# Patient Record
Sex: Female | Born: 2000 | Race: Asian
Health system: Southern US, Academic
[De-identification: ages and names within clinical notes are randomized; demographics above are authoritative.]

## PROBLEM LIST (undated history)

## (undated) ENCOUNTER — Telehealth

## (undated) ENCOUNTER — Encounter

## (undated) ENCOUNTER — Inpatient Hospital Stay: Payer: Medicaid Other

## (undated) DIAGNOSIS — J45909 Unspecified asthma, uncomplicated: Secondary | ICD-10-CM

## (undated) DIAGNOSIS — D649 Anemia, unspecified: Secondary | ICD-10-CM

## (undated) DIAGNOSIS — D499 Neoplasm of unspecified behavior of unspecified site: Secondary | ICD-10-CM

## (undated) DIAGNOSIS — I10 Essential (primary) hypertension: Secondary | ICD-10-CM

## (undated) HISTORY — PX: CRANIOTOMY: SHX93

## (undated) HISTORY — DX: Essential (primary) hypertension: I10

## (undated) HISTORY — PX: OTHER SURGICAL HISTORY: SHX169

## (undated) HISTORY — PX: ADENOIDECTOMY: SUR15

## (undated) HISTORY — PX: TONSILLECTOMY: SUR1361

---
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
## 2020-03-07 ENCOUNTER — Ambulatory Visit
Admit: 2020-03-07 | Discharge: 2020-03-08 | Payer: Medicaid Other | Attending: Obstetrics & Gynecology | Primary: Family Medicine

## 2020-03-07 DIAGNOSIS — F909 Attention-deficit hyperactivity disorder, unspecified type: Secondary | ICD-10-CM

## 2020-03-07 DIAGNOSIS — L659 Nonscarring hair loss, unspecified: Secondary | ICD-10-CM

## 2020-03-07 DIAGNOSIS — Z3490 Encounter for supervision of normal pregnancy, unspecified, unspecified trimester: Principal | ICD-10-CM

## 2020-03-07 DIAGNOSIS — N809 Endometriosis, unspecified: Secondary | ICD-10-CM

## 2020-03-07 DIAGNOSIS — 493 Asthma: Principal | ICD-9-CM

## 2020-03-07 DIAGNOSIS — D649 Anemia, unspecified: Secondary | ICD-10-CM

## 2020-03-07 DIAGNOSIS — Z9109 Other allergy status, other than to drugs and biological substances: Secondary | ICD-10-CM

## 2020-03-07 DIAGNOSIS — M899 Disorder of bone, unspecified: Secondary | ICD-10-CM

## 2020-03-30 ENCOUNTER — Ambulatory Visit: Admit: 2020-03-30 | Discharge: 2020-03-31 | Payer: Medicaid Other | Attending: Family | Primary: Family Medicine

## 2020-03-30 DIAGNOSIS — Z3A15 15 weeks gestation of pregnancy: Secondary | ICD-10-CM

## 2020-03-30 DIAGNOSIS — D649 Anemia, unspecified: Secondary | ICD-10-CM

## 2020-03-30 DIAGNOSIS — F419 Anxiety disorder, unspecified: Secondary | ICD-10-CM

## 2020-03-30 DIAGNOSIS — 493 Asthma: Principal | ICD-9-CM

## 2020-03-30 DIAGNOSIS — M899 Disorder of bone, unspecified: Secondary | ICD-10-CM

## 2020-03-30 DIAGNOSIS — Z9109 Other allergy status, other than to drugs and biological substances: Secondary | ICD-10-CM

## 2020-03-30 DIAGNOSIS — O99012 Anemia complicating pregnancy, second trimester: Secondary | ICD-10-CM

## 2020-03-30 DIAGNOSIS — R7989 Other specified abnormal findings of blood chemistry: Secondary | ICD-10-CM

## 2020-03-30 DIAGNOSIS — Z8742 Personal history of other diseases of the female genital tract: Secondary | ICD-10-CM

## 2020-03-30 DIAGNOSIS — O0992 Supervision of high risk pregnancy, unspecified, second trimester: Principal | ICD-10-CM

## 2020-03-30 DIAGNOSIS — Z9889 Other specified postprocedural states: Secondary | ICD-10-CM

## 2020-03-30 DIAGNOSIS — F32A Anxiety and depression: Secondary | ICD-10-CM

## 2020-03-30 DIAGNOSIS — Z8279 Family history of other congenital malformations, deformations and chromosomal abnormalities: Secondary | ICD-10-CM

## 2020-03-30 DIAGNOSIS — O99212 Obesity complicating pregnancy, second trimester: Secondary | ICD-10-CM

## 2020-03-30 DIAGNOSIS — N809 Endometriosis, unspecified: Secondary | ICD-10-CM

## 2020-03-30 DIAGNOSIS — F909 Attention-deficit hyperactivity disorder, unspecified type: Secondary | ICD-10-CM

## 2020-03-30 DIAGNOSIS — L659 Nonscarring hair loss, unspecified: Secondary | ICD-10-CM

## 2020-03-30 DIAGNOSIS — Z8619 Personal history of other infectious and parasitic diseases: Secondary | ICD-10-CM

## 2020-03-30 MED ORDER — PROMETHAZINE HCL 25 MG PO TABS
ORAL
Start: 2020-03-30 — End: ?

## 2020-03-30 MED ORDER — ACETIC ACID 3% SOLN JX
Freq: Once | Status: CN
Start: 2020-03-30 — End: ?

## 2020-03-30 MED ORDER — FERRIC SUBSULFATE 259 MG/GM EX SOLN
TOPICAL | Status: CN | PRN
Start: 2020-03-30 — End: ?

## 2020-03-30 MED ORDER — ASPIRIN 81 MG PO CHEW
81 mg | Freq: Every day | ORAL | 1 refills | Status: CP
Start: 2020-03-30 — End: ?

## 2020-03-30 MED ORDER — PRENATAL 1 PO
ORAL_TABLET | ORAL
Start: 2020-03-30 — End: ?

## 2020-04-05 DIAGNOSIS — D75839 Thrombocytosis: Secondary | ICD-10-CM

## 2020-04-05 DIAGNOSIS — O99012 Anemia complicating pregnancy, second trimester: Secondary | ICD-10-CM

## 2020-04-05 DIAGNOSIS — O0992 Supervision of high risk pregnancy, unspecified, second trimester: Principal | ICD-10-CM

## 2020-04-26 ENCOUNTER — Ambulatory Visit
Admit: 2020-04-26 | Discharge: 2020-04-27 | Payer: Medicaid Other | Attending: Hematology & Oncology | Primary: Family Medicine

## 2020-04-26 DIAGNOSIS — 493 Asthma: Principal | ICD-9-CM

## 2020-04-26 DIAGNOSIS — N809 Endometriosis, unspecified: Secondary | ICD-10-CM

## 2020-04-26 DIAGNOSIS — O99012 Anemia complicating pregnancy, second trimester: Secondary | ICD-10-CM

## 2020-04-26 DIAGNOSIS — L659 Nonscarring hair loss, unspecified: Secondary | ICD-10-CM

## 2020-04-26 DIAGNOSIS — D649 Anemia, unspecified: Secondary | ICD-10-CM

## 2020-04-26 DIAGNOSIS — F909 Attention-deficit hyperactivity disorder, unspecified type: Secondary | ICD-10-CM

## 2020-04-26 DIAGNOSIS — O0992 Supervision of high risk pregnancy, unspecified, second trimester: Principal | ICD-10-CM

## 2020-04-26 DIAGNOSIS — D75839 Thrombocytosis: Secondary | ICD-10-CM

## 2020-04-26 DIAGNOSIS — Z9109 Other allergy status, other than to drugs and biological substances: Secondary | ICD-10-CM

## 2020-04-26 DIAGNOSIS — M899 Disorder of bone, unspecified: Secondary | ICD-10-CM

## 2020-04-26 MED ORDER — HEMAX 150-1 MG PO TABS
1 mg | ORAL_TABLET | Freq: Every day | ORAL | 6 refills | Status: CP
Start: 2020-04-26 — End: ?

## 2020-04-27 ENCOUNTER — Encounter: Attending: Medical Oncology | Primary: Family Medicine

## 2020-05-04 ENCOUNTER — Ambulatory Visit: Admit: 2020-05-04 | Discharge: 2020-05-05 | Payer: Medicaid Other | Primary: Family Medicine

## 2020-05-04 ENCOUNTER — Ambulatory Visit: Admit: 2020-05-04 | Discharge: 2020-05-05 | Payer: Medicaid Other | Attending: Family | Primary: Family Medicine

## 2020-05-04 ENCOUNTER — Ambulatory Visit
Admit: 2020-05-04 | Discharge: 2020-05-05 | Payer: Medicaid Other | Attending: Maternal & Fetal Medicine | Primary: Family Medicine

## 2020-05-04 ENCOUNTER — Inpatient Hospital Stay: Admit: 2020-05-04 | Discharge: 2020-05-05 | Payer: Medicaid Other | Primary: Family Medicine

## 2020-05-04 DIAGNOSIS — D75839 Thrombocytosis: Secondary | ICD-10-CM

## 2020-05-04 DIAGNOSIS — O99212 Obesity complicating pregnancy, second trimester: Secondary | ICD-10-CM

## 2020-05-04 DIAGNOSIS — Z362 Encounter for other antenatal screening follow-up: Principal | ICD-10-CM

## 2020-05-04 DIAGNOSIS — Z8279 Family history of other congenital malformations, deformations and chromosomal abnormalities: Secondary | ICD-10-CM

## 2020-05-04 DIAGNOSIS — O0992 Supervision of high risk pregnancy, unspecified, second trimester: Principal | ICD-10-CM

## 2020-05-04 DIAGNOSIS — Z3A20 20 weeks gestation of pregnancy: Secondary | ICD-10-CM

## 2020-05-04 DIAGNOSIS — F419 Anxiety disorder, unspecified: Secondary | ICD-10-CM

## 2020-05-04 DIAGNOSIS — Z9889 Other specified postprocedural states: Secondary | ICD-10-CM

## 2020-05-04 DIAGNOSIS — R7989 Other specified abnormal findings of blood chemistry: Secondary | ICD-10-CM

## 2020-05-04 DIAGNOSIS — Z8742 Personal history of other diseases of the female genital tract: Secondary | ICD-10-CM

## 2020-05-04 DIAGNOSIS — O99012 Anemia complicating pregnancy, second trimester: Secondary | ICD-10-CM

## 2020-05-04 DIAGNOSIS — F32A Anxiety and depression: Secondary | ICD-10-CM

## 2020-05-04 MED ORDER — ONDANSETRON 4 MG PO TBDP
4 mg | Freq: Once | ORAL | 0 refills | Status: CP
Start: 2020-05-04 — End: ?

## 2020-05-04 MED ORDER — FERROUS SULFATE ER PO
ORAL
Start: 2020-05-04 — End: ?

## 2020-05-30 ENCOUNTER — Inpatient Hospital Stay: Admit: 2020-05-30 | Discharge: 2020-05-30 | Payer: Medicaid Other

## 2020-06-07 ENCOUNTER — Encounter: Primary: Family Medicine

## 2020-06-07 ENCOUNTER — Inpatient Hospital Stay: Admit: 2020-06-07 | Discharge: 2020-06-07

## 2020-06-07 DIAGNOSIS — F32A Depression: Secondary | ICD-10-CM

## 2020-06-07 DIAGNOSIS — F4310 Post-traumatic stress disorder, unspecified: Secondary | ICD-10-CM

## 2020-06-07 DIAGNOSIS — N939 Abnormal uterine and vaginal bleeding, unspecified: Secondary | ICD-10-CM

## 2020-06-07 DIAGNOSIS — Z9109 Other allergy status, other than to drugs and biological substances: Secondary | ICD-10-CM

## 2020-06-07 DIAGNOSIS — F329 Major depressive disorder, single episode, unspecified: Secondary | ICD-10-CM

## 2020-06-07 DIAGNOSIS — R102 Pelvic and perineal pain: Secondary | ICD-10-CM

## 2020-06-07 DIAGNOSIS — J45909 Unspecified asthma, uncomplicated: Secondary | ICD-10-CM

## 2020-06-07 DIAGNOSIS — F909 Attention-deficit hyperactivity disorder, unspecified type: Secondary | ICD-10-CM

## 2020-06-07 DIAGNOSIS — F319 Bipolar disorder, unspecified: Secondary | ICD-10-CM

## 2020-06-07 DIAGNOSIS — M419 Scoliosis, unspecified: Secondary | ICD-10-CM

## 2020-06-07 DIAGNOSIS — Z8619 Personal history of other infectious and parasitic diseases: Secondary | ICD-10-CM

## 2020-06-07 DIAGNOSIS — A64 Unspecified sexually transmitted disease: Secondary | ICD-10-CM

## 2020-06-07 DIAGNOSIS — G4700 Insomnia, unspecified: Secondary | ICD-10-CM

## 2020-06-07 DIAGNOSIS — A6000 Herpesviral infection of urogenital system, unspecified: Secondary | ICD-10-CM

## 2020-06-07 DIAGNOSIS — Z202 Contact with and (suspected) exposure to infections with a predominantly sexual mode of transmission: Principal | ICD-10-CM

## 2020-06-07 MED ORDER — CEFTRIAXONE SODIUM 1 G IJ SOLR
500 mg | Freq: Once | INTRAMUSCULAR | Status: CP
Start: 2020-06-07 — End: ?

## 2020-06-07 MED ORDER — DOXYCYCLINE HYCLATE 100 MG PO TABS
100 mg | Freq: Two times a day (BID) | ORAL | 0 refills | Status: CP
Start: 2020-06-07 — End: ?

## 2020-06-07 MED ORDER — DOXYCYCLINE HYCLATE 100 MG PO CAPS
100 mg | Freq: Once | ORAL | Status: CP
Start: 2020-06-07 — End: ?

## 2020-06-07 MED ORDER — ONDANSETRON 4 MG PO TBDP
4 mg | Freq: Once | ORAL | Status: CP
Start: 2020-06-07 — End: ?

## 2020-06-07 MED ORDER — CEPHALEXIN 500 MG PO CAPS
500 mg | Freq: Two times a day (BID) | ORAL | 0 refills | 7.00000 days | Status: CP
Start: 2020-06-07 — End: ?

## 2020-06-08 ENCOUNTER — Ambulatory Visit: Payer: Medicaid Other | Primary: Family Medicine

## 2020-06-08 ENCOUNTER — Encounter: Attending: Family | Primary: Family Medicine

## 2020-06-09 MED ORDER — METRONIDAZOLE 500 MG PO TABS
2000 mg | Freq: Once | ORAL | 0 refills | Status: CP
Start: 2020-06-09 — End: ?

## 2020-06-09 MED ORDER — METRONIDAZOLE 500 MG PO TABS
1000 mg | Freq: Once | ORAL | 0 refills | Status: CP
Start: 2020-06-09 — End: 2020-06-09

## 2020-06-14 ENCOUNTER — Inpatient Hospital Stay: Attending: Hematology & Oncology | Primary: Family Medicine

## 2020-06-16 ENCOUNTER — Inpatient Hospital Stay: Admit: 2020-06-16 | Discharge: 2020-06-17 | Payer: Medicaid Other | Primary: Family Medicine

## 2020-06-16 DIAGNOSIS — O99012 Anemia complicating pregnancy, second trimester: Secondary | ICD-10-CM

## 2020-06-16 DIAGNOSIS — O0992 Supervision of high risk pregnancy, unspecified, second trimester: Secondary | ICD-10-CM

## 2020-06-16 DIAGNOSIS — Z362 Encounter for other antenatal screening follow-up: Principal | ICD-10-CM

## 2020-06-16 DIAGNOSIS — D75839 Thrombocytosis: Secondary | ICD-10-CM

## 2020-07-06 ENCOUNTER — Ambulatory Visit: Admit: 2020-07-06 | Discharge: 2020-07-07 | Payer: Medicaid Other | Attending: Family | Primary: Family Medicine

## 2020-07-06 DIAGNOSIS — Z9889 Other specified postprocedural states: Secondary | ICD-10-CM

## 2020-07-06 DIAGNOSIS — Z3A29 29 weeks gestation of pregnancy: Secondary | ICD-10-CM

## 2020-07-06 DIAGNOSIS — O0993 Supervision of high risk pregnancy, unspecified, third trimester: Principal | ICD-10-CM

## 2020-07-06 DIAGNOSIS — L659 Nonscarring hair loss, unspecified: Secondary | ICD-10-CM

## 2020-07-06 DIAGNOSIS — O2600 Excessive weight gain in pregnancy, unspecified trimester: Secondary | ICD-10-CM

## 2020-07-06 DIAGNOSIS — N809 Endometriosis, unspecified: Secondary | ICD-10-CM

## 2020-07-06 DIAGNOSIS — D75839 Thrombocytosis: Secondary | ICD-10-CM

## 2020-07-06 DIAGNOSIS — O99212 Obesity complicating pregnancy, second trimester: Secondary | ICD-10-CM

## 2020-07-06 DIAGNOSIS — Z9109 Other allergy status, other than to drugs and biological substances: Secondary | ICD-10-CM

## 2020-07-06 DIAGNOSIS — O99012 Anemia complicating pregnancy, second trimester: Secondary | ICD-10-CM

## 2020-07-06 DIAGNOSIS — F32A Anxiety and depression: Secondary | ICD-10-CM

## 2020-07-06 DIAGNOSIS — R8271 Bacteriuria: Secondary | ICD-10-CM

## 2020-07-06 DIAGNOSIS — M899 Disorder of bone, unspecified: Secondary | ICD-10-CM

## 2020-07-06 DIAGNOSIS — D649 Anemia, unspecified: Secondary | ICD-10-CM

## 2020-07-06 DIAGNOSIS — Z8742 Personal history of other diseases of the female genital tract: Secondary | ICD-10-CM

## 2020-07-06 DIAGNOSIS — Z8279 Family history of other congenital malformations, deformations and chromosomal abnormalities: Secondary | ICD-10-CM

## 2020-07-06 DIAGNOSIS — F419 Anxiety disorder, unspecified: Secondary | ICD-10-CM

## 2020-07-06 DIAGNOSIS — 493 Asthma: Principal | ICD-9-CM

## 2020-07-06 DIAGNOSIS — F909 Attention-deficit hyperactivity disorder, unspecified type: Secondary | ICD-10-CM

## 2020-07-06 MED ORDER — FERROUS SULFATE 325 (65 FE) MG PO TABS
325 mg | Freq: Every day | ORAL | 1 refills | Status: CP
Start: 2020-07-06 — End: ?

## 2020-07-06 MED ORDER — TETANUS-DIPHTH-ACELL PERTUSSIS 5-2-15.5 LF-MCG/0.5 IM SUSP
.5 mL | Freq: Once | INTRAMUSCULAR | Status: CP
Start: 2020-07-06 — End: ?

## 2020-07-07 DIAGNOSIS — D649 Anemia, unspecified: Secondary | ICD-10-CM

## 2020-07-07 DIAGNOSIS — M899 Disorder of bone, unspecified: Secondary | ICD-10-CM

## 2020-07-07 DIAGNOSIS — Z9109 Other allergy status, other than to drugs and biological substances: Secondary | ICD-10-CM

## 2020-07-07 DIAGNOSIS — N809 Endometriosis, unspecified: Secondary | ICD-10-CM

## 2020-07-07 DIAGNOSIS — 493 Asthma: Principal | ICD-9-CM

## 2020-07-07 DIAGNOSIS — L659 Nonscarring hair loss, unspecified: Secondary | ICD-10-CM

## 2020-07-07 DIAGNOSIS — F909 Attention-deficit hyperactivity disorder, unspecified type: Secondary | ICD-10-CM

## 2020-07-07 MED ORDER — DIPHENHYDRAMINE HCL 25 MG PO CAPS
25 mg | Freq: Once | ORAL | Status: CP
Start: 2020-07-07 — End: ?

## 2020-07-07 MED ORDER — ACETAMINOPHEN 325 MG PO TABS
650 mg | Freq: Once | ORAL | Status: CP
Start: 2020-07-07 — End: ?

## 2020-07-07 MED ORDER — BOLUS IV FLUID JX
100 mL | INTRAVENOUS | Status: CP | PRN
Start: 2020-07-07 — End: ?

## 2020-07-08 ENCOUNTER — Inpatient Hospital Stay: Admit: 2020-07-08 | Payer: Medicaid Other

## 2020-07-08 DIAGNOSIS — N809 Endometriosis, unspecified: Secondary | ICD-10-CM

## 2020-07-08 DIAGNOSIS — F909 Attention-deficit hyperactivity disorder, unspecified type: Secondary | ICD-10-CM

## 2020-07-08 DIAGNOSIS — M899 Disorder of bone, unspecified: Secondary | ICD-10-CM

## 2020-07-08 DIAGNOSIS — D649 Anemia, unspecified: Secondary | ICD-10-CM

## 2020-07-08 DIAGNOSIS — L659 Nonscarring hair loss, unspecified: Secondary | ICD-10-CM

## 2020-07-08 DIAGNOSIS — 493 Asthma: Principal | ICD-9-CM

## 2020-07-08 DIAGNOSIS — Z9109 Other allergy status, other than to drugs and biological substances: Secondary | ICD-10-CM

## 2020-07-08 MED ORDER — DOCUSATE SODIUM 100 MG PO CAPS
100 mg | Freq: Two times a day (BID) | ORAL | Status: DC
Start: 2020-07-08 — End: 2020-07-09

## 2020-07-08 MED ORDER — FERROUS SULFATE 325 (65 FE) MG PO TABS-TBEC JX
325 mg | Freq: Three times a day (TID) | ORAL | Status: DC
Start: 2020-07-08 — End: 2020-07-09

## 2020-07-08 MED ORDER — PRENATAL 27-0.8 MG PO TABS
1 | ORAL_TABLET | Freq: Every day | ORAL | Status: DC
Start: 2020-07-08 — End: 2020-07-09

## 2020-07-12 ENCOUNTER — Encounter: Primary: Family Medicine

## 2020-07-14 ENCOUNTER — Ambulatory Visit: Primary: Family Medicine

## 2020-07-20 ENCOUNTER — Encounter: Attending: Family | Primary: Family Medicine

## 2020-07-27 ENCOUNTER — Inpatient Hospital Stay: Attending: Family | Primary: Family Medicine

## 2020-07-28 ENCOUNTER — Encounter: Primary: Family Medicine

## 2020-07-31 ENCOUNTER — Ambulatory Visit: Admit: 2020-07-31 | Discharge: 2020-08-01 | Attending: Advanced Practice Midwife | Primary: Family Medicine

## 2020-07-31 ENCOUNTER — Inpatient Hospital Stay: Admit: 2020-07-31 | Discharge: 2020-08-01 | Primary: Family Medicine

## 2020-07-31 DIAGNOSIS — O99012 Anemia complicating pregnancy, second trimester: Secondary | ICD-10-CM

## 2020-07-31 DIAGNOSIS — O99212 Obesity complicating pregnancy, second trimester: Secondary | ICD-10-CM

## 2020-07-31 DIAGNOSIS — D75839 Thrombocytosis: Secondary | ICD-10-CM

## 2020-07-31 DIAGNOSIS — R7309 Other abnormal glucose: Secondary | ICD-10-CM

## 2020-07-31 DIAGNOSIS — O99810 Abnormal glucose complicating pregnancy: Principal | ICD-10-CM

## 2020-07-31 DIAGNOSIS — Z3A32 32 weeks gestation of pregnancy: Secondary | ICD-10-CM

## 2020-07-31 DIAGNOSIS — O0992 Supervision of high risk pregnancy, unspecified, second trimester: Principal | ICD-10-CM

## 2020-07-31 DIAGNOSIS — O0993 Supervision of high risk pregnancy, unspecified, third trimester: Principal | ICD-10-CM

## 2020-07-31 DIAGNOSIS — O2600 Excessive weight gain in pregnancy, unspecified trimester: Secondary | ICD-10-CM

## 2020-08-10 ENCOUNTER — Ambulatory Visit: Admit: 2020-08-10 | Discharge: 2020-08-11 | Attending: Maternal & Fetal Medicine | Primary: Family Medicine

## 2020-08-10 DIAGNOSIS — O0992 Supervision of high risk pregnancy, unspecified, second trimester: Principal | ICD-10-CM

## 2020-08-10 DIAGNOSIS — R7309 Other abnormal glucose: Secondary | ICD-10-CM

## 2020-08-17 ENCOUNTER — Encounter: Attending: Student in an Organized Health Care Education/Training Program | Primary: Family Medicine

## 2020-08-28 ENCOUNTER — Ambulatory Visit
Admit: 2020-08-28 | Discharge: 2020-08-29 | Payer: Medicaid Other | Attending: Student in an Organized Health Care Education/Training Program | Primary: Family Medicine

## 2020-08-28 DIAGNOSIS — N898 Other specified noninflammatory disorders of vagina: Secondary | ICD-10-CM

## 2020-08-28 DIAGNOSIS — O0993 Supervision of high risk pregnancy, unspecified, third trimester: Principal | ICD-10-CM

## 2020-09-07 ENCOUNTER — Ambulatory Visit
Admit: 2020-09-07 | Discharge: 2020-09-08 | Payer: Medicaid Other | Attending: Obstetrics & Gynecology | Primary: Family Medicine

## 2020-09-07 DIAGNOSIS — O99012 Anemia complicating pregnancy, second trimester: Secondary | ICD-10-CM

## 2020-09-07 DIAGNOSIS — O99212 Obesity complicating pregnancy, second trimester: Secondary | ICD-10-CM

## 2020-09-07 DIAGNOSIS — Z3A38 38 weeks gestation of pregnancy: Secondary | ICD-10-CM

## 2020-09-07 DIAGNOSIS — B379 Candidiasis, unspecified: Secondary | ICD-10-CM

## 2020-09-07 DIAGNOSIS — O0993 Supervision of high risk pregnancy, unspecified, third trimester: Principal | ICD-10-CM

## 2020-09-07 MED ORDER — METRONIDAZOLE 500 MG PO TABS
500 mg | Freq: Two times a day (BID) | ORAL | 0 refills | Status: CP
Start: 2020-09-07 — End: ?

## 2020-09-07 MED ORDER — FLUCONAZOLE 150 MG PO TABS
150 mg | Freq: Once | ORAL | 0 refills | 8.00000 days | Status: CP
Start: 2020-09-07 — End: ?

## 2020-09-15 ENCOUNTER — Ambulatory Visit
Admit: 2020-09-15 | Discharge: 2020-09-16 | Payer: Medicaid Other | Attending: Student in an Organized Health Care Education/Training Program | Primary: Family Medicine

## 2020-09-15 DIAGNOSIS — O0993 Supervision of high risk pregnancy, unspecified, third trimester: Principal | ICD-10-CM

## 2020-09-15 DIAGNOSIS — Z3A39 39 weeks gestation of pregnancy: Secondary | ICD-10-CM

## 2020-09-19 ENCOUNTER — Ambulatory Visit: Admit: 2020-09-19 | Discharge: 2020-09-20 | Payer: Medicaid Other | Primary: Family Medicine

## 2020-09-19 DIAGNOSIS — Z3A39 39 weeks gestation of pregnancy: Secondary | ICD-10-CM

## 2020-09-19 DIAGNOSIS — O0993 Supervision of high risk pregnancy, unspecified, third trimester: Principal | ICD-10-CM

## 2020-09-20 ENCOUNTER — Encounter: Attending: Family | Primary: Family Medicine

## 2020-09-24 ENCOUNTER — Inpatient Hospital Stay: Admit: 2020-09-24 | Discharge: 2020-09-25 | Payer: Medicaid Other

## 2020-09-24 DIAGNOSIS — L659 Nonscarring hair loss, unspecified: Secondary | ICD-10-CM

## 2020-09-24 DIAGNOSIS — Z9109 Other allergy status, other than to drugs and biological substances: Secondary | ICD-10-CM

## 2020-09-24 DIAGNOSIS — D649 Anemia, unspecified: Secondary | ICD-10-CM

## 2020-09-24 DIAGNOSIS — N809 Endometriosis, unspecified: Secondary | ICD-10-CM

## 2020-09-24 DIAGNOSIS — 493 Asthma: Principal | ICD-9-CM

## 2020-09-24 DIAGNOSIS — B9689 Other specified bacterial agents as the cause of diseases classified elsewhere: Secondary | ICD-10-CM

## 2020-09-24 DIAGNOSIS — F909 Attention-deficit hyperactivity disorder, unspecified type: Secondary | ICD-10-CM

## 2020-09-24 DIAGNOSIS — B373 Candidiasis of vulva and vagina: Principal | ICD-10-CM

## 2020-09-24 DIAGNOSIS — M899 Disorder of bone, unspecified: Secondary | ICD-10-CM

## 2020-09-24 DIAGNOSIS — N760 Acute vaginitis: Secondary | ICD-10-CM

## 2020-09-24 MED ORDER — METRONIDAZOLE 500 MG PO TABS
500 mg | Freq: Two times a day (BID) | ORAL | 0 refills | Status: CP
Start: 2020-09-24 — End: ?

## 2020-09-24 MED ORDER — FLUCONAZOLE 150 MG PO TABS
150 mg | Freq: Once | ORAL | 0 refills | 8.00000 days | Status: CP
Start: 2020-09-24 — End: ?

## 2020-09-24 MED ORDER — ACETAMINOPHEN 325 MG PO TABS
650 mg | Freq: Once | ORAL | Status: CP
Start: 2020-09-24 — End: ?

## 2020-09-25 ENCOUNTER — Inpatient Hospital Stay: Admit: 2020-09-25 | Discharge: 2020-09-26 | Payer: Medicaid Other | Primary: Family Medicine

## 2020-09-28 ENCOUNTER — Encounter: Primary: Family Medicine

## 2020-09-29 ENCOUNTER — Emergency Department: Admit: 2020-09-29 | Payer: Medicaid Other

## 2020-09-29 ENCOUNTER — Encounter: Primary: Family Medicine

## 2020-09-29 MED ORDER — BUTORPHANOL TARTRATE 2 MG/ML IJ SOLN
1 mg | Freq: Once | INTRAVENOUS | Status: CP
Start: 2020-09-29 — End: ?

## 2020-09-29 MED ORDER — AMPICILLIN 1 G IV MBP
1000 mg | INTRAVENOUS | Status: CP
Start: 2020-09-29 — End: ?

## 2020-09-29 MED ORDER — PROMETHAZINE HCL 25 MG/ML IJ SOLN
12.5 mg | Freq: Once | INTRAVENOUS | Status: CP
Start: 2020-09-29 — End: ?

## 2020-09-29 MED ORDER — BOLUS IV FLUID JX
INTRAVENOUS | Status: CP | PRN
Start: 2020-09-29 — End: ?

## 2020-09-29 MED ORDER — TERBUTALINE SULFATE 1 MG/ML IJ SOLN
.25 mg | SUBCUTANEOUS | Status: CP | PRN
Start: 2020-09-29 — End: ?

## 2020-09-29 MED ORDER — LACTATED RINGERS IV SOLN
INTRAVENOUS | Status: CP
Start: 2020-09-29 — End: ?

## 2020-09-29 MED ORDER — OXYTOCIN 10 UNIT/ML IJ SOLN
10 [IU] | INTRAMUSCULAR | Status: CP | PRN
Start: 2020-09-29 — End: ?

## 2020-09-29 MED ORDER — OXYTOCIN-SODIUM CHLORIDE 30-0.9 UT/500 ML-% IV SOLN
334 mL/h | INTRAVENOUS | Status: CP
Start: 2020-09-29 — End: ?

## 2020-09-29 MED ORDER — FENTANYL-BUPIVACAINE-NACL 0.5-0.125-0.9 MG/250ML-% EP SOLN
EPIDURAL | Status: CP
Start: 2020-09-29 — End: ?

## 2020-09-29 MED ORDER — OXYTOCIN-SODIUM CHLORIDE 30-0.9 UT/500 ML-% IV SOLN
1-20 m[IU]/min | INTRAVENOUS | Status: CP
Start: 2020-09-29 — End: ?

## 2020-09-29 MED ORDER — OXYTOCIN-SODIUM CHLORIDE 30-0.9 UT/500 ML-% IV SOLN
1-20 m[IU]/min | INTRAVENOUS | Status: DC
Start: 2020-09-29 — End: 2020-09-30

## 2020-09-29 MED ORDER — AMPICILLIN 2 G IV MBP
2000 mg | Freq: Once | INTRAVENOUS | Status: CP
Start: 2020-09-29 — End: ?

## 2020-09-29 MED ORDER — HYDROXYZINE HCL 25 MG PO TABS
25 mg | Freq: Three times a day (TID) | ORAL | Status: CP | PRN
Start: 2020-09-29 — End: ?

## 2020-09-29 MED ORDER — MISOPROSTOL 25 MCG PO TAB (INPATIENT)
25 ug | Freq: Once | BUCCAL | Status: CP
Start: 2020-09-29 — End: ?

## 2020-09-29 MED ORDER — MINERAL OIL PO OIL 30ML UD
30 mL | Freq: Once | TOPICAL | Status: CP | PRN
Start: 2020-09-29 — End: ?

## 2020-09-29 MED ORDER — LIDOCAINE HCL (PF) 1 % IJ SOLN SH
20 mL | SUBCUTANEOUS | Status: CP | PRN
Start: 2020-09-29 — End: ?

## 2020-09-30 ENCOUNTER — Encounter: Primary: Family Medicine

## 2020-09-30 MED ORDER — LIDOCAINE-EPINEPHRINE 1.5 %-1:200000 IJ SOLN
EPIDURAL | PRN
Start: 2020-09-30 — End: ?

## 2020-09-30 MED ORDER — LIDOCAINE HCL (PF) 1 % IJ SOLN SH
PRN
Start: 2020-09-30 — End: ?

## 2020-10-01 DIAGNOSIS — N809 Endometriosis, unspecified: Secondary | ICD-10-CM

## 2020-10-01 DIAGNOSIS — M899 Disorder of bone, unspecified: Secondary | ICD-10-CM

## 2020-10-01 DIAGNOSIS — D649 Anemia, unspecified: Secondary | ICD-10-CM

## 2020-10-01 DIAGNOSIS — L659 Nonscarring hair loss, unspecified: Secondary | ICD-10-CM

## 2020-10-01 DIAGNOSIS — F909 Attention-deficit hyperactivity disorder, unspecified type: Secondary | ICD-10-CM

## 2020-10-01 DIAGNOSIS — 493 Asthma: Principal | ICD-9-CM

## 2020-10-01 DIAGNOSIS — Z9109 Other allergy status, other than to drugs and biological substances: Secondary | ICD-10-CM

## 2020-10-01 MED ORDER — DOCUSATE SODIUM 100 MG PO CAPS
100 mg | Freq: Two times a day (BID) | ORAL | Status: CP
Start: 2020-10-01 — End: ?

## 2020-10-01 MED ORDER — DIPHENHYDRAMINE HCL 50 MG/ML IJ SOLN
25 mg | Freq: Three times a day (TID) | INTRAVENOUS | Status: CP | PRN
Start: 2020-10-01 — End: ?

## 2020-10-01 MED ORDER — ACETAMINOPHEN 325 MG PO TABS
650 mg | ORAL | Status: CP | PRN
Start: 2020-10-01 — End: ?

## 2020-10-01 MED ORDER — OXYTOCIN-SODIUM CHLORIDE 30-0.9 UT/500 ML-% IV SOLN
40 m[IU]/min | INTRAVENOUS | Status: CP | PRN
Start: 2020-10-01 — End: ?

## 2020-10-01 MED ORDER — FERROUS SULFATE 325 (65 FE) MG PO TABS-TBEC JX
325 mg | Freq: Every day | ORAL | Status: CP
Start: 2020-10-01 — End: ?

## 2020-10-01 MED ORDER — ONDANSETRON HCL 4 MG/2 ML IJ SOLN SH
4 mg | Freq: Three times a day (TID) | INTRAVENOUS | Status: CP | PRN
Start: 2020-10-01 — End: ?

## 2020-10-01 MED ORDER — OXYTOCIN 10 UNIT/ML IJ SOLN
10 [IU] | Freq: Once | INTRAMUSCULAR | Status: CP | PRN
Start: 2020-10-01 — End: ?

## 2020-10-01 MED ORDER — SIMETHICONE 80 MG PO CHEW
80 mg | Freq: Four times a day (QID) | ORAL | Status: CP | PRN
Start: 2020-10-01 — End: ?

## 2020-10-01 MED ORDER — BISACODYL 10 MG RE SUPP
10 mg | Freq: Every day | RECTAL | Status: CP | PRN
Start: 2020-10-01 — End: ?

## 2020-10-01 MED ORDER — ONDANSETRON 4 MG PO TBDP
4 mg | Freq: Three times a day (TID) | ORAL | Status: CP | PRN
Start: 2020-10-01 — End: ?

## 2020-10-01 MED ORDER — IBUPROFEN 800 MG PO TABS
800 mg | Freq: Four times a day (QID) | ORAL | Status: CP
Start: 2020-10-01 — End: ?

## 2020-10-01 MED ORDER — MAGNESIUM HYDROXIDE 2400 MG/30ML PO SUSP UD
30 mL | Freq: Two times a day (BID) | ORAL | Status: CP | PRN
Start: 2020-10-01 — End: ?

## 2020-10-01 MED ORDER — BENZOCAINE-MENTHOL 20-0.5 % EX AERO
TOPICAL | Status: CP | PRN
Start: 2020-10-01 — End: ?

## 2020-10-01 MED ORDER — SODIUM CHLORIDE FLUSH 0.9 % IV SOLN
5 mL | Freq: Two times a day (BID) | INTRAVENOUS | Status: CP
Start: 2020-10-01 — End: ?

## 2020-10-01 MED ORDER — MISOPROSTOL 200 MCG PO TABS
800 ug | Freq: Once | RECTAL | Status: CP | PRN
Start: 2020-10-01 — End: ?

## 2020-10-01 MED ORDER — FAMOTIDINE 20 MG PO TABS
20 mg | Freq: Two times a day (BID) | ORAL | Status: CP
Start: 2020-10-01 — End: ?

## 2020-10-01 MED ORDER — LANOLIN OINT
TOPICAL | Status: CP | PRN
Start: 2020-10-01 — End: ?

## 2020-10-01 MED ORDER — BOLUS IV FLUID JX
1000 mL | INTRAVENOUS | Status: CP | PRN
Start: 2020-10-01 — End: ?

## 2020-10-01 MED ORDER — OXYTOCIN-SODIUM CHLORIDE 30-0.9 UT/500 ML-% IV SOLN
95 mL/h | INTRAVENOUS | Status: CP
Start: 2020-10-01 — End: ?

## 2020-10-01 MED ORDER — DIPHENHYDRAMINE HCL 25 MG PO CAPS
25 mg | Freq: Three times a day (TID) | ORAL | Status: CP | PRN
Start: 2020-10-01 — End: ?

## 2020-10-01 MED ORDER — SODIUM CHLORIDE FLUSH 0.9 % IV SOLN
5 mL | INTRAVENOUS | Status: CP | PRN
Start: 2020-10-01 — End: ?

## 2020-10-03 DIAGNOSIS — O09299 Supervision of pregnancy with other poor reproductive or obstetric history, unspecified trimester: Secondary | ICD-10-CM | POA: Insufficient documentation

## 2020-10-03 DIAGNOSIS — O1400 Mild to moderate pre-eclampsia, unspecified trimester: Secondary | ICD-10-CM | POA: Insufficient documentation

## 2020-10-03 MED ORDER — FERROUS SULFATE 325 (65 FE) MG PO TABS
325 mg | Freq: Every day | ORAL | 2 refills | Status: CP
Start: 2020-10-03 — End: ?

## 2020-10-03 MED ORDER — NIVA-PLUS 27-1 MG PO TABS
1 | ORAL_TABLET | Freq: Every day | ORAL | 4 refills | 30.00000 days | Status: CP
Start: 2020-10-03 — End: ?

## 2020-10-03 MED ORDER — IBUPROFEN 800 MG PO TABS
800 mg | Freq: Four times a day (QID) | ORAL | 2 refills | 25.00000 days | Status: CP | PRN
Start: 2020-10-03 — End: ?

## 2020-10-03 MED ORDER — COMFORT TOUCH BP CUFF/MEDIUM MISC
1 | 0 refills | Status: CP | PRN
Start: 2020-10-03 — End: ?

## 2020-10-03 MED ORDER — DOCUSATE SODIUM 100 MG PO CAPS
100 mg | Freq: Two times a day (BID) | ORAL | 1 refills | 60.00 days | Status: CP
Start: 2020-10-03 — End: ?

## 2020-10-06 ENCOUNTER — Encounter: Primary: Family Medicine

## 2020-10-10 ENCOUNTER — Inpatient Hospital Stay: Attending: Student in an Organized Health Care Education/Training Program | Primary: Family Medicine

## 2020-11-06 ENCOUNTER — Ambulatory Visit
Admit: 2020-11-06 | Discharge: 2020-11-07 | Payer: Medicaid Other | Attending: Advanced Practice Midwife | Primary: Family Medicine

## 2020-11-06 DIAGNOSIS — Z30016 Encounter for initial prescription of transdermal patch hormonal contraceptive device: Principal | ICD-10-CM

## 2020-11-06 MED ORDER — NORELGESTROMIN-ETH ESTRADIOL 150-35 MCG/24HR TD PTWK
1 | MEDICATED_PATCH | TRANSDERMAL | 11 refills | Status: CP
Start: 2020-11-06 — End: ?

## 2021-05-27 NOTE — L&D Delivery Note (Signed)
OB/GYN Faculty Practice Delivery Note  Te Nae Rocks is a 21 y.o. G2P1001 s/p SVD at [redacted]w[redacted]d She was admitted for eIOL and BMI>40.   ROM: 4h 249mith clear fluid GBS Status:  Negative/-- (08/02 1538) Maximum Maternal Temperature: 98.54F  Labor Progress: Initial SVE: 1.5/thick/-3. She then progressed to complete.   Delivery Date/Time: 01/08/22 0833 Delivery: Called to room and patient was complete and pushing. Head delivered ROA. No nuchal cord present. Shoulder and body delivered in usual fashion. Infant with spontaneous cry, placed on mother's abdomen, dried and stimulated. Cord clamped x 2 after 1-minute delay, and cut by FOB. Cord blood drawn. Placenta delivered spontaneously with gentle cord traction. Fundus firm with massage and Pitocin. Labia, perineum, vagina, and cervix inspected with no lacerations noted.  Baby Weight: pending  Placenta: 3 vessel, intact. Sent to L&D Complications: None Lacerations: none EBL: 136 mL Analgesia: Epidural   Infant:  APGAR (1 MIN): 8   APGAR (5 MINS): 9 Town LineDO OB Family Medicine Fellow, [facility]Low[facility] 01/08/2022, 8:47 AM

## 2021-05-29 ENCOUNTER — Ambulatory Visit (INDEPENDENT_AMBULATORY_CARE_PROVIDER_SITE_OTHER): Payer: Medicaid Other | Admitting: Primary Care

## 2021-06-25 ENCOUNTER — Telehealth: Payer: Self-pay

## 2021-06-29 ENCOUNTER — Inpatient Hospital Stay (HOSPITAL_COMMUNITY): Payer: Medicaid Other

## 2021-06-29 ENCOUNTER — Other Ambulatory Visit: Payer: Self-pay

## 2021-06-29 ENCOUNTER — Encounter (HOSPITAL_COMMUNITY): Payer: Self-pay | Admitting: Emergency Medicine

## 2021-06-29 ENCOUNTER — Inpatient Hospital Stay (HOSPITAL_COMMUNITY)
Admission: AD | Admit: 2021-06-29 | Discharge: 2021-06-29 | Disposition: A | Discharge status: Home / Self Care | Payer: Medicaid Other | Attending: Obstetrics and Gynecology | Admitting: Obstetrics and Gynecology

## 2021-06-29 DIAGNOSIS — R1030 Lower abdominal pain, unspecified: Secondary | ICD-10-CM | POA: Insufficient documentation

## 2021-06-29 DIAGNOSIS — Z3491 Encounter for supervision of normal pregnancy, unspecified, first trimester: Secondary | ICD-10-CM | POA: Diagnosis not present

## 2021-06-29 DIAGNOSIS — O26852 Spotting complicating pregnancy, second trimester: Secondary | ICD-10-CM | POA: Diagnosis not present

## 2021-06-29 DIAGNOSIS — O209 Hemorrhage in early pregnancy, unspecified: Secondary | ICD-10-CM | POA: Diagnosis not present

## 2021-06-29 DIAGNOSIS — O26892 Other specified pregnancy related conditions, second trimester: Secondary | ICD-10-CM | POA: Diagnosis present

## 2021-06-29 DIAGNOSIS — O26899 Other specified pregnancy related conditions, unspecified trimester: Secondary | ICD-10-CM | POA: Diagnosis not present

## 2021-06-29 DIAGNOSIS — N939 Abnormal uterine and vaginal bleeding, unspecified: Secondary | ICD-10-CM

## 2021-06-29 DIAGNOSIS — Z3A12 12 weeks gestation of pregnancy: Secondary | ICD-10-CM | POA: Diagnosis not present

## 2021-06-29 HISTORY — DX: Unspecified asthma, uncomplicated: J45.909

## 2021-06-29 HISTORY — DX: Anemia, unspecified: D64.9

## 2021-06-29 HISTORY — DX: Neoplasm of unspecified behavior of unspecified site: D49.9

## 2021-06-29 LAB — URINALYSIS, MICROSCOPIC (REFLEX)

## 2021-06-29 LAB — WET PREP, GENITAL
Clue Cells Wet Prep HPF POC: NONE SEEN
Sperm: NONE SEEN
Trich, Wet Prep: NONE SEEN
WBC, Wet Prep HPF POC: >=10 — AB (ref <10)
Yeast Wet Prep HPF POC: NONE SEEN

## 2021-06-29 LAB — URINALYSIS, ROUTINE W REFLEX MICROSCOPIC
Bilirubin Urine: NEGATIVE
Glucose, UA: NEGATIVE mg/dL
Ketones, ur: NEGATIVE mg/dL
Leukocytes,Ua: NEGATIVE
Nitrite: NEGATIVE
Protein, ur: NEGATIVE mg/dL
Specific Gravity, Urine: 1.025 (ref 1.005–1.030)
pH: 6.5 (ref 5.0–8.0)

## 2021-06-29 LAB — ABO/RH: ABO/RH(D): O POS

## 2021-06-29 LAB — CBC
HCT: 35.9 % — ABNORMAL LOW (ref 36.0–46.0)
Hemoglobin: 11.8 g/dL — ABNORMAL LOW (ref 12.0–15.0)
MCH: 25.9 pg — ABNORMAL LOW (ref 26.0–34.0)
MCHC: 32.9 g/dL (ref 30.0–36.0)
MCV: 78.9 fL — ABNORMAL LOW (ref 80.0–100.0)
Platelets: 333 10*3/uL (ref 150–400)
RBC: 4.55 MIL/uL (ref 3.87–5.11)
RDW: 13.7 % (ref 11.5–15.5)
WBC: 7.3 10*3/uL (ref 4.0–10.5)
nRBC: 0 % (ref 0.0–0.2)

## 2021-06-29 LAB — POC URINE PREG, ED: Preg Test, Ur: POSITIVE — AB

## 2021-06-29 LAB — HCG, QUANTITATIVE, PREGNANCY: hCG, Beta Chain, Quant, S: 59962 m[IU]/mL — ABNORMAL HIGH (ref <5)

## 2021-06-29 NOTE — MAU Note (Signed)
Knew she was pregnant.  +HPTs early January. Has been having pink d/c for like a wk now. Had it with her first and was told it was ok, so hadn't been paying too much attention.  Became more vibrant last night. Started cramping this morning. Was worried about ovarian cysts.  Has hx of cysts and endometriosis. Pain is RLQ, as she is sitting, doesn't really feel it, feels wit some movements and when standing.

## 2021-06-29 NOTE — ED Notes (Signed)
Per ED mini lab, point of care urine test result: positive. Currently unable to import result into Epic due to technical problem.

## 2021-06-29 NOTE — ED Provider Triage Note (Signed)
Emergency Medicine Provider Triage Evaluation Note  Nichole [name] , a 21 y.o. female  was evaluated in triage.  Pt complains of abdominal cramping and vaginal spotting.  She is G2 P1.  Her last menstrual period was March 03, 2021.  Has associated lower abdominal cramping. She does not have any OB care at this time.  Denies loss of vaginal fluids, contractions, need to push.  Review of Systems  Positive: As per HPI above. Negative: Loss of vaginal fluids, contraction  Physical Exam  BP 129/76 (BP Location: Left Arm)    Pulse 99    Temp 98.1 F (36.7 C)    Resp 14    LMP 03/03/2021 (Exact Date)    SpO2 100%  Gen:   Awake, no distress   Resp:  Normal effort  MSK:   Moves extremities without difficulty  Other:  No abdominal tenderness to palpation  Medical Decision Making  Medically screening exam initiated at 1:39 PM.  Appropriate orders placed.  Nichole Sen was informed that the remainder of the evaluation will be completed by another provider, this initial triage assessment does not replace that evaluation, and the importance of remaining in the ED until their evaluation is complete.  1:39 PM -lab called and notified of positive pregnancy test.  1:50 PM -call placed to MAU, Erin accepts patient in transfer.   Chandria Rookstool A, PA-C 06/29/21 1352

## 2021-06-29 NOTE — ED Triage Notes (Signed)
Patient here with complaint of right groin pain that started yesterday and vaginal bleeding that started this morning. History of ovarian cysts and endometriosis. G2P1, last menstrual period March 03, 2021.  Patient is alert, oriented, and in no apparent distress at this time.

## 2021-06-29 NOTE — MAU Provider Note (Signed)
History     CSN: [id number]  Arrival date and time: 06/29/21 1311   Event Date/Time   First Provider Initiated Contact with Patient 06/29/21 1453      Chief Complaint  Patient presents with   Abdominal Pain   Vaginal Bleeding   HPI Nichole [name] is a 21 y.o. G2P1001 at [redacted]w[redacted]d by LMP who presents to MAU for spotting and abdominal pain. Patient reports a positive home UPT at home. Has had spotting for 1 week. Started having some lower abdominal cramping 2 days ago. Pain is intermittent and is worsened with sneezing, coughing, and movement. She has not tried anything to relieve pain. She denies itching, odor, urinary s/s, or fever. Reports LMP was 02/27/2021, but has a history of irregular periods.   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  Social History   Tobacco Use   Smoking status: Never   Smokeless tobacco: Never  Substance Use Topics   Alcohol use: Not Currently   Drug use: Never    Allergies:  Allergies  Allergen Reactions   Peanut (Diagnostic) Hives    No medications prior to admission.    Review of Systems  Constitutional: Negative.   Respiratory: Negative.    Cardiovascular: Negative.   Gastrointestinal:  Positive for abdominal pain (cramping).  Genitourinary:  Positive for vaginal bleeding. Negative for dysuria and vaginal discharge.  Musculoskeletal: Negative.   Neurological: Negative.   Physical Exam   Blood pressure 130/75, pulse 86, temperature 98.1 F (36.7 C), resp. rate 17, last menstrual period 03/03/2021, SpO2 100  %.  Physical Exam Vitals and nursing note reviewed.  Constitutional:      General: She is not in acute distress.    Appearance: She is obese.  Cardiovascular:     Rate and Rhythm: Normal rate.  Pulmonary:     Effort: Pulmonary effort is normal.  Abdominal:     Palpations: Abdomen is soft.     Tenderness: There is no abdominal tenderness. There is no guarding.  Genitourinary:    Comments: Blind swabs obtained Skin:    General: Skin is warm and dry.  Neurological:     General: No focal deficit present.     Mental Status: She is alert and oriented to person, place, and time.  Psychiatric:        Mood and Affect: Mood normal.        Behavior: Behavior normal.   US OB Comp Less 14 Wks  Result Date: 06/29/2021 CLINICAL DATA:  Vaginal bleeding for 1 week, pain. EXAM: OBSTETRIC <14 WK ULTRASOUND TECHNIQUE: Transabdominal ultrasound was performed for evaluation of the gestation as well as the maternal uterus and adnexal regions. COMPARISON:  None. FINDINGS: Intrauterine gestational sac: Single Yolk sac:  Not Visualized. Embryo:  Visualized. Cardiac Activity: Visualized. Heart Rate: 167 bpm CRL:   59 mm   12 w 3 d                  [place] EDC: 01/08/2022 Subchorionic hemorrhage:  None visualized. Maternal uterus/adnexae: Normal IMPRESSION: 1. Single live intrauterine gestation with approximate gestational age of [redacted] weeks and 3 days, EDC based on today's sonogram is 01/08/2022. 2. No sonographic evidence of subchorionic hemorrhage. 3. Uterus and adnexa are unremarkable. Electronically Signed   By: Keane Police D.O.   On: 06/29/2021 15:47    MAU Course  Procedures UA, culture pending CBC, HCG, ABO/RH-O pos Wet prep-neg, GC/CT [place]   MDM Wet prep negative. Urine culture and GC/CT pending. Blood type is O pos, rhogam not indicated. US shows IUP with FHR.  Assessment and Plan  [redacted] weeks gestation of pregnancy IUP Vaginal bleeding affecting pregnancy  - Discharge home in stable condition - Strict  return precautions reviewed - Start prenatal care ASAP with OB of your choice. List of OBGYN's provided - Return to MAU sooner or as needed for worsening symptoms     Renee Harder, CNM 06/29/2021, 4:27 PM

## 2021-06-29 NOTE — Discharge Instructions (Signed)
Safe Medications in Pregnancy    Acne: Benzoyl Peroxide Salicylic Acid  Backache/Headache: Tylenol: 2 regular strength every 4 hours OR              2 Extra strength every 6 hours  Colds/Coughs/Allergies: Benadryl (alcohol free) 25 mg every 6 hours as needed Breath right strips Claritin Cepacol throat lozenges Chloraseptic throat spray Cold-Eeze- up to three times per day Cough drops, alcohol free Flonase (by prescription only) Guaifenesin Mucinex Robitussin DM (plain only, alcohol free) Saline nasal spray/drops Sudafed (pseudoephedrine) & Actifed ** use only after [redacted] weeks gestation and if you do not have high blood pressure Tylenol Vicks Vaporub Zinc lozenges Zyrtec   Constipation: Colace Ducolax suppositories Fleet enema Glycerin suppositories Metamucil Milk of magnesia Miralax Senokot Smooth move tea  Diarrhea: Kaopectate Imodium A-D  *NO pepto Bismol  Hemorrhoids: Anusol Anusol HC Preparation H Tucks  Indigestion: Tums Maalox Mylanta Zantac  Pepcid  Insomnia: Benadryl (alcohol free) 25mg every 6 hours as needed Tylenol PM Unisom, no Gelcaps  Leg Cramps: Tums MagGel  Nausea/Vomiting:  Bonine Dramamine Emetrol Ginger extract Sea bands Meclizine  Nausea medication to take during pregnancy:  Unisom (doxylamine succinate 25 mg tablets) Take one tablet daily at bedtime. If symptoms are not adequately controlled, the dose can be increased to a maximum recommended dose of two tablets daily (1/2 tablet in the morning, 1/2 tablet mid-afternoon and one at bedtime). Vitamin B6 100mg tablets. Take one tablet twice a day (up to 200 mg per day).  Skin Rashes: Aveeno products Benadryl cream or 25mg every 6 hours as needed Calamine Lotion 1% cortisone cream  Yeast infection: Gyne-lotrimin 7 Monistat 7   **If taking multiple medications, please check labels to avoid duplicating the same active ingredients **take  medication as directed on the label ** Do not exceed 4000 mg of tylenol in 24 hours **Do not take medications that contain aspirin or ibuprofen   [place] [facility] Providers   [facility]             [address], [place], [place] [address] [phone]  [facility]                                                             [address], [place], [place], [address] [phone]  [facility]                                    [address], [address], [place], [place], [address] [phone]  [facility] [place] [address], [place], [place], [address] [phone]  [facility] [place]                                 [address], [place], [place], [address] [phone]  [facility]                                      [address], [place], [place], [address] [phone]  [facility] [address] [address], [place], [place], [address]                              [facility] [address], [place], [place], [address] [phone]  [facility]         Phone: [phone]  [facility]      Phone: [phone]   [facility]      Phone: [phone]  [facility]         Phone: [phone]   [facility]    Phone: [phone]  [facility]      Phone: [phone]  [facility]      Phone: [phone]  [facility]        Phone: [phone]  [facility]      Phone: [phone]  

## 2021-06-30 LAB — CULTURE, OB URINE: Culture: <10000 — AB

## 2021-07-02 LAB — GC/CHLAMYDIA PROBE AMP (~~LOC~~) NOT AT ARMC
Chlamydia: NEGATIVE
Comment: NEGATIVE
Comment: NORMAL
Neisseria Gonorrhea: NEGATIVE

## 2021-07-10 ENCOUNTER — Telehealth (INDEPENDENT_AMBULATORY_CARE_PROVIDER_SITE_OTHER): Payer: Medicaid Other

## 2021-07-10 DIAGNOSIS — Z3480 Encounter for supervision of other normal pregnancy, unspecified trimester: Secondary | ICD-10-CM

## 2021-07-10 DIAGNOSIS — Z3A00 Weeks of gestation of pregnancy not specified: Secondary | ICD-10-CM

## 2021-07-10 MED ORDER — GOJJI WEIGHT SCALE MISC: 1.0000 | 0 refills | Status: DC

## 2021-07-10 MED ORDER — BLOOD PRESSURE MONITORING DEVI: 1.0000 | 0 refills | Status: DC

## 2021-07-10 NOTE — Progress Notes (Signed)
New OB Intake  I connected with  Nichole [name] on 07/10/21 at  1:15 PM EST by MyChart Video Visit and verified that I am speaking with the correct person using two identifiers. Nurse is located at [facility] and pt is located at home.  I discussed the limitations, risks, security and privacy concerns of performing an evaluation and management service by telephone and the availability of in person appointments. I also discussed with the patient that there may be a patient responsible charge related to this service. The patient expressed understanding and agreed to proceed.  I explained I am completing New OB Intake today. We discussed her EDD of 01/08/22 that is based on U/S on 06/29/21. Pt is G2/P1. I reviewed her allergies, medications, Medical/Surgical/OB history, and appropriate screenings. I informed her of [facility] services. Based on history, this is a/an  pregnancy uncomplicated .   There are no problems to display for this patient.   Concerns addressed today  Delivery Plans:  Plans to deliver at [facility].   MyChart/Babyscripts MyChart access verified. I explained pt will have some visits in office and some virtually. Babyscripts instructions given and order placed. Patient verifies receipt of registration text/e-mail. Account successfully created and app downloaded.  Blood Pressure Cuff  Blood pressure cuff ordered for patient to pick-up from [employer]. Explained after first prenatal appt pt will check weekly and document in 21.  Weight scale: Patient does / does not  have weight scale. Weight scale ordered for patient to pick up from [employer].   Anatomy US Explained first scheduled [place] will be around 19 weeks. Anatomy US scheduled for 08/14/21 at 0145p. Pt notified to arrive at 0130p. Scheduled AFP lab only appointment if CenteringPregnancy pt for same day as anatomy US.   Labs Discussed Johnsie Cancel genetic screening with patient. Would like both Panorama and Horizon drawn  at new OB visit.Also if interested in genetic testing, tell patient she will need AFP 15-21 weeks to complete genetic testing .Routine prenatal labs needed.  Covid Vaccine Patient has not covid vaccine.   CenteringPregnancy Candidate? Accepted If yes, offer as possibility  Mother/ Baby Dyad Candidate?   N/A If yes, offer as possibility  Informed patient of [facility] website  and placed link in her AVS.   Social Determinants of Health Food Insecurity: Patient denies food insecurity. WIC Referral: Patient is interested in referral to [facility].  Transportation: Patient denies transportation needs. Childcare: Discussed no children allowed at ultrasound appointments. Offered childcare services; patient declines childcare services at this time.  Send link to Pregnancy Navigators   Placed OB Box on problem list and updated  First visit review I reviewed new OB appt with pt. I explained she will have a pelvic exam, ob bloodwork with genetic screening, and PAP smear. Explained pt will be seen by Dr. Kennon Rounds at first visit; encounter routed to appropriate provider. Explained that patient will be seen by pregnancy navigator following visit with provider. [facility] information placed in AVS.   Bethanne Ginger, CMA 07/10/2021  1:32 PM

## 2021-07-10 NOTE — Patient Instructions (Addendum)
At our [facility], we work as an integrated team, providing care to address both physical and emotional health. Your medical provider may refer you to see our [facility]) on the same day you see your medical provider, as availability permits; often scheduled virtually at your convenience.  Our [facility] is available to all patients, visits generally last between 20-30 minutes, but can be longer or shorter, depending on patient need. The [facility] offers help with stress management, coping with symptoms of depression and anxiety, major life changes , sleep issues, changing risky behavior, grief and loss, life stress, working on personal life goals, and  behavioral health issues, as these all affect your overall health and wellness.  The [facility] is NOT available for the following: FMLA paperwork, court-ordered evaluations, specialty assessments (custody or disability), letters to employers, or obtaining certification for an emotional support animal. The [facility] does not provide long-term therapy. You have the right to refuse integrated behavioral health services, or to reschedule to see the [facility] at a later date.  Confidentiality exception: If it is suspected that a child or disabled adult is being abused or neglected, we are required by law to report that to either Child Protective Services or Adult Scientist, forensic.  If you have a diagnosis of Bipolar affective disorder, Schizophrenia, or recurrent Major depressive disorder, we will recommend that you establish care with a psychiatrist, as these are lifelong, chronic conditions, and we want your overall emotional health and medications to be more closely monitored. If you anticipate needing extended maternity leave due to mental health issues postpartum, it it recommended you inform your medical provider, so we can put in a referral to a psychiatrist as soon as possible. The [facility] is unable to recommend an extended maternity leave for mental  health issues. Your medical provider or [facility] may refer you to a therapist for ongoing, traditional therapy, or to a psychiatrist, for medication management, if it would benefit your overall health. Depending on your insurance, you may have a copay or be charged a deductible, depending on your insurance, to see the [facility]. If you are uninsured, it is recommended that you apply for financial assistance. (Forms may be requested at the front desk for in-person visits, via MyChart, or request a form during a virtual visit).  If you see the [facility] more than 6 times, you will have to complete a comprehensive clinical assessment interview with the [facility] to resume integrated services.  For virtual visits with the [facility] you must be physically in the state of [place] at the time of the visit. For example, if you live in [place], you will have to do an in-person visit with the [facility], and your out-of-state insurance may not cover behavioral health services in [place]. If you are going out of the state or country for any reason, the [facility] may see you virtually when you return to [place], but not while you are physically outside of [place].   Childbirth Education Options: [facility] Department Classes:  Childbirth education classes can help you get ready for a positive parenting experience. You can also meet other expectant parents and get free stuff for your baby. Each class runs for five weeks on the same night and costs $45 for the mother-to-be and her support person. Medicaid covers the cost if you are eligible. Call [phone] to register. [place] Childbirth Education: Classes can vary in availability and schedule is subject to change. For most  up-to-date information please visit [email or web address] to review and register.

## 2021-07-11 ENCOUNTER — Other Ambulatory Visit: Payer: Self-pay | Admitting: Advanced Practice Midwife

## 2021-07-11 ENCOUNTER — Telehealth: Payer: Self-pay | Admitting: *Deleted

## 2021-07-11 ENCOUNTER — Encounter: Payer: Self-pay | Admitting: Advanced Practice Midwife

## 2021-07-11 ENCOUNTER — Telehealth: Payer: Self-pay

## 2021-07-11 DIAGNOSIS — D499 Neoplasm of unspecified behavior of unspecified site: Secondary | ICD-10-CM | POA: Insufficient documentation

## 2021-07-11 DIAGNOSIS — Z86011 Personal history of benign neoplasm of the brain: Secondary | ICD-10-CM

## 2021-07-11 NOTE — Telephone Encounter (Addendum)
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 We asked her to come to the office to to complete an ROI for Neuro records from [place]. Order placed.   Per referral note-  [facility] would like to notes before scheduling pt.  Per Vaughan Basta, RN pt is to come by office tomorrow to sign ROI to obtain records from [place].   Frances Nickels

## 2021-07-11 NOTE — Telephone Encounter (Signed)
Called patient with Nichole [name], CNM about her interest in CenteringPregnancy and we discussed her history of having a brain tumor and craniotomy. We discussed we would love to add her to CenteringPregnancy but first we need to know more about her history of her tumor and what follow up she had and those doctor's recommendations. She said she was supposed to have follow up but never did because her mom went on drugs. She had the tumor and surgery when she was in 5th grade. We asked if we could help facilitate a referral to a neurologist and she agreed. We asked her to come to office and sign release for records for the neurologist she had in [place]. She is going to check with her Mom to find out the name and come by the office asap to sign a release.  Osie Merkin,RN

## 2021-07-12 ENCOUNTER — Telehealth: Payer: Self-pay | Admitting: *Deleted

## 2021-07-12 NOTE — Telephone Encounter (Signed)
Discussed with Dr. [name] and Nichole [name] is approved for CenteringPregnancy ; but we still need to proceed with plan to get records from neurologist in [place] and make neuro referral.  I called [name] and left a message I was calling with information about her appointments and will send a detailed MyChart message- please review.  [name]

## 2021-07-13 NOTE — Telephone Encounter (Addendum)
Called pt. Pt was not able to come to sign ROI yesterday due to sick child. Unable to locate records via Care Everywhere. Pt may call Neuro office to request these be sent to our office. If patient is unable to contact them, she will come by office today or Monday to sign records.

## 2021-07-17 ENCOUNTER — Ambulatory Visit: Payer: Self-pay

## 2021-07-17 NOTE — Telephone Encounter (Signed)
°  Chief Complaint: vaginal discharge x1 Symptoms: mucus plug like discharge from vagina Frequency: 1x Pertinent Negatives: Patient denies fever, smell pain bumps itching Disposition: [] ED /[] Urgent Care (no appt availability in office) / [] Appointment(In office/virtual)/ []  [facility]/ [x] Home Care/ [] Refused Recommended Disposition /[]  Mobile Bus/ []  Follow-up with PCP Additional Notes: IC within 24 hours. Small amount of clear mucus discharge 1 time. Pt will c/b if needed.   Reason for Disposition  Normal vaginal discharge in pregnancy  Answer Assessment - Initial Assessment Questions 1. DISCHARGE: "Describe the discharge." (e.g., white, yellow, green, gray, foamy, cottage cheese-like)     clear 2. ODOR: "Is there a bad odor?"     no 3. ONSET: "When did the discharge begin?"     Once today 4. RASH: "Is there a rash in that area?" If Yes, ask: "Describe it." (e.g., redness, blisters, sores, bumps)     no 5. ABDOMINAL PAIN: "Are you having any abdominal pain?" If Yes, ask: "What does it feel like?" (e.g., crampy, dull, intermittent, constant)      dull 6. ABDOMINAL PAIN SEVERITY: If present, ask: "How bad is it?"  (e.g., Scale 1-10; mild, moderate, or severe)   - MILD (1-3): doesn't interfere with normal activities, abdomen soft and not tender to touch    - MODERATE (4-7): interferes with normal activities or awakens from sleep, abdomen tender to touch    - SEVERE (8-10): excruciating pain, doubled over, unable to do any normal activities     1 7. CAUSE: "What do you think is causing the discharge?"     Pt thinks mucus plug 8. OTHER SYMPTOMS: "Do you have any other symptoms?" (e.g., fever, itching, vaginal bleeding, pain with urination)     no 9. EDD: "What date are you expecting to deliver?"      15 weeks 10. PREGNANCY: "How many weeks pregnant are you?"       15 weeks  Protocols used: Pregnancy - Vaginal Discharge-A-AH

## 2021-07-18 ENCOUNTER — Telehealth: Payer: Self-pay

## 2021-07-18 NOTE — Telephone Encounter (Signed)
Left message for pt that I am calling in regards to her Centering Pregnancy appt on 08/06/21 that will start promptly at 0900 and end at 1100.  We do request no visitors at this time due to the COVID restrictions.  I also let the pt know that I have sent a MyChart message as well and she can respond to message or give the office a call back.    Nichole [name]  07/18/21

## 2021-07-24 ENCOUNTER — Ambulatory Visit: Payer: Medicaid Other | Admitting: Family

## 2021-07-27 ENCOUNTER — Ambulatory Visit (INDEPENDENT_AMBULATORY_CARE_PROVIDER_SITE_OTHER): Payer: Medicaid Other | Admitting: Family Medicine

## 2021-07-27 ENCOUNTER — Encounter: Payer: Self-pay | Admitting: Family Medicine

## 2021-07-27 ENCOUNTER — Other Ambulatory Visit: Payer: Self-pay

## 2021-07-27 ENCOUNTER — Other Ambulatory Visit (HOSPITAL_COMMUNITY)
Admission: RE | Admit: 2021-07-27 | Discharge: 2021-07-27 | Disposition: A | Discharge status: Home / Self Care | Payer: Medicaid Other | Source: Ambulatory Visit | Attending: Family Medicine | Admitting: Family Medicine

## 2021-07-27 VITALS — BP 113/74 | HR 97 | Ht 67.0 in | Wt 230.0 lb

## 2021-07-27 DIAGNOSIS — Z23 Encounter for immunization: Secondary | ICD-10-CM | POA: Diagnosis not present

## 2021-07-27 DIAGNOSIS — L309 Dermatitis, unspecified: Secondary | ICD-10-CM | POA: Insufficient documentation

## 2021-07-27 DIAGNOSIS — J4520 Mild intermittent asthma, uncomplicated: Secondary | ICD-10-CM

## 2021-07-27 DIAGNOSIS — Z3480 Encounter for supervision of other normal pregnancy, unspecified trimester: Secondary | ICD-10-CM | POA: Diagnosis not present

## 2021-07-27 DIAGNOSIS — L299 Pruritus, unspecified: Secondary | ICD-10-CM

## 2021-07-27 DIAGNOSIS — L2082 Flexural eczema: Secondary | ICD-10-CM

## 2021-07-27 MED ORDER — ALBUTEROL SULFATE HFA 108 (90 BASE) MCG/ACT IN AERS: 2.0000 | INHALATION_SPRAY | Freq: Four times a day (QID) | RESPIRATORY_TRACT | 2 refills | Status: DC | PRN

## 2021-07-27 NOTE — Progress Notes (Signed)
Patient is her to start prenatal care. She believes she has occasional  "round ligament" pains along with "braxton hicks"  ?

## 2021-07-27 NOTE — Progress Notes (Signed)
? ?  ? ?Subjective:  ? ?Nichole [name] is a 21 y.o. G2P1001 at [redacted]w[redacted]d by early ultrasound being seen today for her first obstetrical visit.  Her obstetrical history is not significant. Patient does intend to breast feed. Pregnancy history fully reviewed. ? ?Patient reports  round ligament pain . ? ?HISTORY: ?OB History  ?Gravida Para Term Preterm AB Living  ?2 1 1  0 0 1  ?SAB IAB Ectopic Multiple Live Births  ?0 0 0 0 1  ?  ?# Outcome Date GA Lbr Len/2nd Weight Sex Delivery Anes PTL Lv  ?2 Current           ?1 Term 10/01/20 [redacted]w[redacted]d  8 lb 11 oz (3.941 kg) M Vag-Spont EPI N LIV  ? Last pap smear was  n/a due to age ?Past Medical History:  ?Diagnosis Date  ? Anemia   ? Asthma   ? 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 ?Past Surgical History:  ?Procedure Laterality Date  ? ADENOIDECTOMY    ? CRANIOTOMY    ? OTHER SURGICAL HISTORY Bilateral   ? had "nasal passages shrunk"  ? TONSILLECTOMY    ? ?Family History  ?Problem Relation Age of Onset  ? Hypertension Father   ? Liver disease Sister   ?     NASH  ? Heart disease Maternal Grandmother   ? Hypertension Maternal Grandmother   ? Diabetes Maternal Grandmother   ? HIV Maternal Grandmother   ? ?Social History  ? ?Tobacco Use  ? Smoking status: Never  ? Smokeless tobacco: Never  ?Substance Use Topics  ? Alcohol use: Not Currently  ? Drug use: Never  ? ?Allergies  ?Allergen Reactions  ? Peanut (Diagnostic) Hives  ? ?Current Outpatient Medications on File Prior to Visit  ?Medication Sig Dispense Refill  ? Prenatal Vit-Fe Fumarate-FA (MULTIVITAMIN-PRENATAL) 27-0.8 MG TABS tablet Take 1 tablet by mouth daily at 12 noon.    ? Blood Pressure Monitoring DEVI 1 each by Does not apply route once a week. (Patient not taking: Reported on 07/27/2021) 1 each 0  ? Misc. Devices (GOJJI WEIGHT SCALE) MISC 1 each by Does not apply route once a week. (Patient not taking: Reported on 07/27/2021) 1 each 0  ? ?No current facility-administered medications on file prior to visit.  ? ? ? ?Exam  ? ?Vitals:  ?  07/27/21 0923 07/27/21 0925  ?BP: 113/74   ?Pulse: 97   ?Weight: 230 lb (104.3 kg)   ?Height:  5\' 7"  (1.702 m)  ? ?Fetal Heart Rate (bpm): 141 ? ?Uterus:     ?Pelvic Exam: Perineum: no hemorrhoids, normal perineum  ? Vulva: normal external genitalia, no lesions  ? Vagina:  normal mucosa, normal discharge  ? Cervix: no lesions and normal, pap smear done.   ? Adnexa: normal adnexa and no mass, fullness, tenderness  ? Bony Pelvis: average  ?System: General: well-developed, well-nourished female in no acute distress  ? Breast:  normal appearance, no masses or tenderness  ? Skin: normal coloration and turgor, no rashes  ? Neurologic: oriented, normal, negative, normal mood  ? Extremities: normal strength, tone, and muscle mass, ROM of all joints is normal  ? HEENT PERRLA, extraocular movement intact and sclera clear, anicteric  ? Mouth/Teeth mucous membranes moist, pharynx normal without lesions and dental hygiene good  ? Neck supple and no masses  ? Cardiovascular: regular rate and rhythm  ? Respiratory:  no respiratory distress, normal breath sounds  ? Abdomen: soft, non-tender; bowel  sounds normal; no masses,  no organomegaly  ? ?  ?Assessment:  ? ?Pregnancy: G2P1001 ?Patient Active Problem List  ? Diagnosis Date Noted  ? Eczema 07/27/2021  ? Asthma, mild intermittent 07/27/2021  ? Tumor 07/11/2021  ? Supervision of other normal pregnancy, antepartum 07/10/2021  ? ?  ?Plan:  ?1. Supervision of other normal pregnancy, antepartum ?New OB labs + genetics ?- AFP, Serum, Open Spina Bifida ?- Cervicovaginal ancillary only( [facility]) ?- Culture, OB Urine ?- CBC/D/Plt+RPR+Rh+ABO+RubIgG... ?- Hemoglobin A1c ?- Flu Vaccine QUAD 38mo+IM (Fluarix, Fluzone & Alfiuria Quad PF) ?- Genetic Screening ? ?2. Generalized pruritus ?Suspect eczema see below will check bile acids ?- Bile acids, total ? ?3. Flexural eczema ?Dove soap only. Lukewarm showers, pat dry. Eucerin or Lubriderm applied daily. ? ?4. Mild intermittent asthma without  complication ?Refilled her inhalers ?- albuterol (VENTOLIN HFA) 108 (90 Base) MCG/ACT inhaler; Inhale 2 puffs into the lungs every 6 (six) hours as needed for wheezing or shortness of breath.  Dispense: 8 g; Refill: 2 ? ? ?Initial labs drawn. ?Continue prenatal vitamins. ?Genetic Screening discussed, NIPS: ordered. ?Ultrasound discussed; fetal anatomic survey: ordered. ?Problem list reviewed and updated. ?The nature of [facility] with multiple MDs and other Advanced Practice Providers was explained to patient; also emphasized that residents, students are part of our team. ?Routine obstetric precautions reviewed. ?Return in 4 weeks (on 08/24/2021) for [facility]-centering. ? ?  ? ?

## 2021-07-27 NOTE — Patient Instructions (Addendum)
Dove soap only. Lukewarm showers, pat dry. Eucerin or Lubriderm applied daily. Spot treatment with hydrocortisone cream. ? ?Second Trimester of Pregnancy ?The second trimester of pregnancy is from week 13 through week 27. This is months 4 through 6 of pregnancy. The second trimester is often a time when you feel your best. Your body has adjusted to being pregnant, and you begin to feel better physically. ?During the second trimester: ?Morning sickness has lessened or stopped completely. ?You may have more energy. ?You may have an increase in appetite. ?The second trimester is also a time when the unborn baby (fetus) is growing rapidly. At the end of the sixth month, the fetus may be up to 12 inches long and weigh about 1? pounds. You will likely begin to feel the baby move (quickening) between 16 and 20 weeks of pregnancy. ?Body changes during your second trimester ?Your body continues to go through many changes during your second trimester. The changes vary and generally return to normal after the baby is born. ?Physical changes ?Your weight will continue to increase. You will notice your lower abdomen bulging out. ?You may begin to get stretch marks on your hips, abdomen, and breasts. ?Your breasts will continue to grow and to become tender. ?Dark spots or blotches (chloasma or mask of pregnancy) may develop on your face. ?A dark line from your belly button to the pubic area (linea nigra) may appear. ?You may have changes in your hair. These can include thickening of your hair, rapid growth, and changes in texture. Some people also have hair loss during or after pregnancy, or hair that feels dry or thin. ?Health changes ?You may develop headaches. ?You may have heartburn. ?You may develop constipation. ?You may develop hemorrhoids or swollen, bulging veins (varicose veins). ?Your gums may bleed and may be sensitive to brushing and flossing. ?You may urinate more often because the fetus is pressing on your  bladder. ?You may have back pain. This is caused by: ?Weight gain. ?Pregnancy hormones that are relaxing the joints in your pelvis. ?A shift in weight and the muscles that support your balance. ?Follow these instructions at home: ?Medicines ?Follow your health care provider's instructions regarding medicine use. Specific medicines may be either safe or unsafe to take during pregnancy. Do not take any medicines unless approved by your health care provider. ?Take a prenatal vitamin that contains at least 600 micrograms (mcg) of folic acid. ?Eating and drinking ?Eat a healthy diet that includes fresh fruits and vegetables, whole grains, good sources of protein such as meat, eggs, or tofu, and low-fat dairy products. ?Avoid raw meat and unpasteurized juice, milk, and cheese. These carry germs that can harm you and your baby. ?You may need to take these actions to prevent or treat constipation: ?Drink enough fluid to keep your urine pale yellow. ?Eat foods that are high in fiber, such as beans, whole grains, and fresh fruits and vegetables. ?Limit foods that are high in fat and processed sugars, such as fried or sweet foods. ?Activity ?Exercise only as directed by your health care provider. Most people can continue their usual exercise routine during pregnancy. Try to exercise for 30 minutes at least 5 days a week. Stop exercising if you develop contractions in your uterus. ?Stop exercising if you develop pain or cramping in the lower abdomen or lower back. ?Avoid exercising if it is very hot or humid or if you are at a high altitude. ?Avoid heavy lifting. ?If you choose to, you may have  sex unless your health care provider tells you not to. ?Relieving pain and discomfort ?Wear a supportive bra to prevent discomfort from breast tenderness. ?Take warm sitz baths to soothe any pain or discomfort caused by hemorrhoids. Use hemorrhoid cream if your health care provider approves. ?Rest with your legs raised (elevated) if you  have leg cramps or low back pain. ?If you develop varicose veins: ?Wear support hose as told by your health care provider. ?Elevate your feet for 15 minutes, 3-4 times a day. ?Limit salt in your diet. ?Safety ?Wear your seat belt at all times when driving or riding in a car. ?Talk with your health care provider if someone is verbally or physically abusive to you. ?Lifestyle ?Do not use hot tubs, steam rooms, or saunas. ?Do not douche. Do not use tampons or scented sanitary pads. ?Avoid cat litter boxes and soil used by cats. These carry germs that can cause birth defects in the baby and possibly loss of the fetus by miscarriage or stillbirth. ?Do not use herbal remedies, alcohol, illegal drugs, or medicines that are not approved by your health care provider. Chemicals in these products can harm your baby. ?Do not use any products that contain nicotine or tobacco, such as cigarettes, e-cigarettes, and chewing tobacco. If you need help quitting, ask your health care provider. ?General instructions ?During a routine prenatal visit, your health care provider will do a physical exam and other tests. He or she will also discuss your overall health. Keep all follow-up visits. This is important. ?Ask your health care provider for a referral to a local prenatal education class. ?Ask for help if you have counseling or nutritional needs during pregnancy. Your health care provider can offer advice or refer you to specialists for help with various needs. ?Where to find more information ?American Pregnancy Association: americanpregnancy.org ?[employer]: PoolDevices.com.pt ?Office on Women's Health: KeywordPortfolios.com.br ?Contact a health care provider if you have: ?A headache that does not go away when you take medicine. ?Vision changes or you see spots in front of your eyes. ?Mild pelvic cramps, pelvic pressure, or nagging pain in the abdominal area. ?Persistent  nausea, vomiting, or diarrhea. ?A bad-smelling vaginal discharge or foul-smelling urine. ?Pain when you urinate. ?Sudden or extreme swelling of your face, hands, ankles, feet, or legs. ?A fever. ?Get help right away if you: ?Have fluid leaking from your vagina. ?Have spotting or bleeding from your vagina. ?Have severe abdominal cramping or pain. ?Have difficulty breathing. ?Have chest pain. ?Have fainting spells. ?Have not felt your baby move for the time period told by your health care provider. ?Have new or increased pain, swelling, or redness in an arm or leg. ?Summary ?The second trimester of pregnancy is from week 13 through week 27 (months 4 through 6). ?Do not use herbal remedies, alcohol, illegal drugs, or medicines that are not approved by your health care provider. Chemicals in these products can harm your baby. ?Exercise only as directed by your health care provider. Most people can continue their usual exercise routine during pregnancy. ?Keep all follow-up visits. This is important. ?This information is not intended to replace advice given to you by your health care provider. Make sure you discuss any questions you have with your health care provider. ?Document Revised: 10/20/2019 Document Reviewed: 08/26/2019 ?Elsevier Patient Education ? Anthony. ? ?

## 2021-07-29 LAB — CBC/D/PLT+RPR+RH+ABO+RUBIGG...
Antibody Screen: NEGATIVE
Basophils Absolute: 0 10*3/uL (ref 0.0–0.2)
Basos: 1 %
EOS (ABSOLUTE): 0.3 10*3/uL (ref 0.0–0.4)
Eos: 6 %
HCV Ab: NONREACTIVE
HIV Screen 4th Generation wRfx: NONREACTIVE
Hematocrit: 36.4 % (ref 34.0–46.6)
Hemoglobin: 11.5 g/dL (ref 11.1–15.9)
Hepatitis B Surface Ag: NEGATIVE
Immature Grans (Abs): 0 10*3/uL (ref 0.0–0.1)
Immature Granulocytes: 0 %
Lymphocytes Absolute: 1.5 10*3/uL (ref 0.7–3.1)
Lymphs: 28 %
MCH: 25.2 pg — ABNORMAL LOW (ref 26.6–33.0)
MCHC: 31.6 g/dL (ref 31.5–35.7)
MCV: 80 fL (ref 79–97)
Monocytes Absolute: 0.5 10*3/uL (ref 0.1–0.9)
Monocytes: 10 %
Neutrophils Absolute: 3 10*3/uL (ref 1.4–7.0)
Neutrophils: 55 %
Platelets: 322 10*3/uL (ref 150–450)
RBC: 4.56 x10E6/uL (ref 3.77–5.28)
RDW: 13.9 % (ref 11.7–15.4)
RPR Ser Ql: NONREACTIVE
Rh Factor: POSITIVE
Rubella Antibodies, IGG: 19.7 index (ref >0.99)
WBC: 5.4 10*3/uL (ref 3.4–10.8)

## 2021-07-29 LAB — AFP, SERUM, OPEN SPINA BIFIDA
AFP MoM: 0.88
AFP Value: 27.5 ng/mL
Gest. Age on Collection Date: 16.4 weeks
Maternal Age At EDD: 20.6 yr
OSBR Risk 1 IN: 10000
Test Results:: NEGATIVE
Weight: 230 [lb_av]

## 2021-07-29 LAB — HEMOGLOBIN A1C
Est. average glucose Bld gHb Est-mCnc: 114 mg/dL
Hgb A1c MFr Bld: 5.6 % (ref 4.8–5.6)

## 2021-07-29 LAB — URINE CULTURE, OB REFLEX

## 2021-07-29 LAB — BILE ACIDS, TOTAL: Bile Acids Total: 1.9 umol/L (ref 0.0–10.0)

## 2021-07-29 LAB — CULTURE, OB URINE

## 2021-07-29 LAB — HCV INTERPRETATION

## 2021-07-30 LAB — CERVICOVAGINAL ANCILLARY ONLY
Chlamydia: NEGATIVE
Comment: NEGATIVE
Comment: NORMAL
Neisseria Gonorrhea: NEGATIVE

## 2021-08-06 ENCOUNTER — Other Ambulatory Visit: Payer: Medicaid Other

## 2021-08-06 ENCOUNTER — Other Ambulatory Visit: Payer: Self-pay

## 2021-08-06 ENCOUNTER — Ambulatory Visit (INDEPENDENT_AMBULATORY_CARE_PROVIDER_SITE_OTHER): Payer: Medicaid Other | Admitting: Advanced Practice Midwife

## 2021-08-06 DIAGNOSIS — Z3A17 17 weeks gestation of pregnancy: Secondary | ICD-10-CM

## 2021-08-06 DIAGNOSIS — O26892 Other specified pregnancy related conditions, second trimester: Secondary | ICD-10-CM

## 2021-08-06 DIAGNOSIS — R109 Unspecified abdominal pain: Secondary | ICD-10-CM

## 2021-08-06 DIAGNOSIS — Z86011 Personal history of benign neoplasm of the brain: Secondary | ICD-10-CM

## 2021-08-06 DIAGNOSIS — L299 Pruritus, unspecified: Secondary | ICD-10-CM

## 2021-08-06 DIAGNOSIS — Z3480 Encounter for supervision of other normal pregnancy, unspecified trimester: Secondary | ICD-10-CM

## 2021-08-06 MED ORDER — HYDROXYZINE PAMOATE 25 MG PO CAPS: 25.0000 mg | ORAL_CAPSULE | Freq: Three times a day (TID) | ORAL | 0 refills | Status: DC | PRN

## 2021-08-06 NOTE — Progress Notes (Signed)
? ? ? ? ? ?  PRENATAL VISIT NOTE- Centering Pregnancy Cycle 2, Session # 2 ? ?Subjective:  ?Nichole [name] is a 21 y.o. G2P1001 at 16w6dbeing seen today for ongoing prenatal care through Centering Pregnancy.  She is currently monitored for the following issues for this low-risk pregnancy and has Supervision of other normal pregnancy, antepartum; Tumor; Eczema; and Asthma, mild intermittent on their problem list. ? ?Patient reports  lower abdominal pain after intercourse, generalized itching .   .  .  Movement: Present. Denies leaking of fluid/ROM.  ? ?The following portions of the patient's history were reviewed and updated as appropriate: allergies, current medications, past family history, past medical history, past social history, past surgical history and problem list. Problem list updated. ? ?Objective:  ?There were no vitals filed for this visit. ? ?Fetal Status: Fetal Heart Rate (bpm): 142   Movement: Present    ? ?General:  Alert, oriented and cooperative. Patient is in no acute distress.  ?Skin: Skin is warm and dry. No rash noted.   ?Cardiovascular: Normal heart rate noted  ?Respiratory: Normal respiratory effort, no problems with respiration noted  ?Abdomen: Soft, gravid, appropriate for gestational age.  Pain/Pressure: Present     ?Pelvic: Cervical exam deferred        ?Extremities: Normal range of motion.     ?Mental Status: Normal mood and affect. Normal behavior. Normal judgment and thought content.  ? ?Assessment and Plan:  ?Pregnancy: G2P1001 at 128w6d ?1. Pruritus ?--generalized itching, difficult to sleep with itching, no visible rash ?--Rx for Vistaril for itching ? ?- Bile acids, total ? ?2. Supervision of other normal pregnancy, antepartum ? ?Centering Pregnancy, Session#2: Reviewed rules for self-governance with group. Direct group to [employer] ? ?Facilitated discussion today:  Nutrition, abdominal pain, wellness/self care, body changes in pregnancy.  ? ?Fundal height and FHR  appropriate today unless noted otherwise in plan. Patient to continue group care.   ? ?3. History of benign brain tumor ?--[facility] ?--[facility] ?--Pt without complications ? ?4. [redacted] weeks gestation of pregnancy ? --Reviewed normal AFP, NIPS results pending, Anatomy [place]uled 3/21 ?--Unable to hear FHT with doppler today, f/u bedside US[place] FHR 142, normal movement. ? ?5. Abdominal pain during pregnancy, second trimester ?--Reviewed management of pain in group, including heat, ice, warm bath, Tylenol PRN.  Reviewed reasons to seek care.  ? ?Preterm labor symptoms and general obstetric precautions including but not limited to vaginal bleeding, contractions, leaking of fluid and fetal movement were reviewed in detail with the patient. ?Please refer to After Visit Summary for other counseling recommendations.  ?Return in about 4 weeks (around 09/03/2021). ? ?Future Appointments  ?Date Time Provider [place]?08/14/2021  1:30 PM [facility] NURSE [facility]  ?08/14/2021  1:45 PM [facility] US5 [facility]  ?09/03/2021  9:00 AM CENTERING PROVIDER [facility] [place]10/01/2021  9:00 AM CENTERING PROVIDER [facility] [place]?10/15/2021  9:00 [facility]  9:00 AM CENTERING PROVIDER [facility] [place]?11/12/2021  9:00 AM CENTERING PROVIDER [facility] [place]11/26/2021  9:00 AM CENTERING PROVIDER [facility] [place]?12/10/2021  9:00 AM CENTERING PROVIDER [facility] [place]?12/24/2021  9:00 AM CENTERING PROVIDER [facility] [place]? ? ?LiFatima BlankCNM ? ?

## 2021-08-06 NOTE — Progress Notes (Signed)
Informal bedside ultrasound performed to assess FHR, FHR 142bpm with fetal movement visualized. ?

## 2021-08-07 LAB — BILE ACIDS, TOTAL: Bile Acids Total: <1 umol/L (ref 0.0–10.0)

## 2021-08-10 ENCOUNTER — Encounter: Payer: Self-pay | Admitting: *Deleted

## 2021-08-12 ENCOUNTER — Inpatient Hospital Stay: Admit: 2021-08-12 | Discharge: 2021-08-12 | Payer: Medicaid Other

## 2021-08-12 DIAGNOSIS — J45909 Unspecified asthma, uncomplicated: Secondary | ICD-10-CM

## 2021-08-12 DIAGNOSIS — F32A Depression: Secondary | ICD-10-CM

## 2021-08-12 DIAGNOSIS — F4310 Post-traumatic stress disorder, unspecified: Secondary | ICD-10-CM

## 2021-08-12 DIAGNOSIS — F909 Attention-deficit hyperactivity disorder, unspecified type: Principal | ICD-10-CM

## 2021-08-12 DIAGNOSIS — G4700 Insomnia, unspecified: Secondary | ICD-10-CM

## 2021-08-12 DIAGNOSIS — B9689 Other specified bacterial agents as the cause of diseases classified elsewhere: Secondary | ICD-10-CM

## 2021-08-12 DIAGNOSIS — M419 Scoliosis, unspecified: Secondary | ICD-10-CM

## 2021-08-12 DIAGNOSIS — A64 Unspecified sexually transmitted disease: Secondary | ICD-10-CM

## 2021-08-12 DIAGNOSIS — F329 Major depressive disorder, single episode, unspecified: Secondary | ICD-10-CM

## 2021-08-12 DIAGNOSIS — F319 Bipolar disorder, unspecified: Secondary | ICD-10-CM

## 2021-08-12 DIAGNOSIS — R109 Unspecified abdominal pain: Secondary | ICD-10-CM

## 2021-08-12 DIAGNOSIS — Z9109 Other allergy status, other than to drugs and biological substances: Secondary | ICD-10-CM

## 2021-08-12 DIAGNOSIS — N760 Acute vaginitis: Principal | ICD-10-CM

## 2021-08-12 MED ORDER — MOXIFLOXACIN HCL 400 MG PO TABS
ORAL | 0 refills | Status: CP
Start: 2021-08-12 — End: ?

## 2021-08-12 MED ORDER — CEFTRIAXONE SODIUM 1 G IJ SOLR
500 mg | Freq: Once | INTRAMUSCULAR | Status: CP
Start: 2021-08-12 — End: ?

## 2021-08-12 MED ORDER — DOXYCYCLINE HYCLATE 100 MG PO TABS
100 mg | Freq: Two times a day (BID) | ORAL | 0 refills | Status: CP
Start: 2021-08-12 — End: ?

## 2021-08-12 MED ORDER — METRONIDAZOLE 500 MG PO TABS
500 mg | Freq: Once | ORAL | Status: CP
Start: 2021-08-12 — End: ?

## 2021-08-12 MED ORDER — DOXYCYCLINE HYCLATE 100 MG PO CAPS
100 mg | Freq: Once | ORAL | Status: CP
Start: 2021-08-12 — End: ?

## 2021-08-12 MED ORDER — METRONIDAZOLE 500 MG PO TABS
500 mg | Freq: Two times a day (BID) | ORAL | 0 refills | Status: CP
Start: 2021-08-12 — End: ?

## 2021-08-12 MED ORDER — ONDANSETRON 4 MG PO TBDP
4 mg | Freq: Once | ORAL | Status: CP
Start: 2021-08-12 — End: ?

## 2021-08-14 ENCOUNTER — Ambulatory Visit: Payer: Medicaid Other | Admitting: *Deleted

## 2021-08-14 ENCOUNTER — Other Ambulatory Visit: Payer: Self-pay

## 2021-08-14 ENCOUNTER — Ambulatory Visit: Payer: Medicaid Other | Attending: Family Medicine

## 2021-08-14 VITALS — BP 117/65 | HR 85

## 2021-08-14 DIAGNOSIS — O99212 Obesity complicating pregnancy, second trimester: Secondary | ICD-10-CM | POA: Diagnosis not present

## 2021-08-14 DIAGNOSIS — Z3A19 19 weeks gestation of pregnancy: Secondary | ICD-10-CM | POA: Insufficient documentation

## 2021-08-14 DIAGNOSIS — O321XX0 Maternal care for breech presentation, not applicable or unspecified: Secondary | ICD-10-CM | POA: Diagnosis not present

## 2021-08-14 DIAGNOSIS — Z3480 Encounter for supervision of other normal pregnancy, unspecified trimester: Secondary | ICD-10-CM

## 2021-08-14 DIAGNOSIS — Z363 Encounter for antenatal screening for malformations: Secondary | ICD-10-CM | POA: Diagnosis present

## 2021-08-15 ENCOUNTER — Other Ambulatory Visit: Payer: Self-pay | Admitting: *Deleted

## 2021-08-15 ENCOUNTER — Encounter: Payer: Self-pay | Admitting: Family Medicine

## 2021-08-15 DIAGNOSIS — D369 Benign neoplasm, unspecified site: Secondary | ICD-10-CM | POA: Insufficient documentation

## 2021-08-15 DIAGNOSIS — Z362 Encounter for other antenatal screening follow-up: Secondary | ICD-10-CM

## 2021-08-20 ENCOUNTER — Telehealth: Payer: Self-pay

## 2021-08-20 ENCOUNTER — Encounter: Payer: Self-pay | Admitting: Family Medicine

## 2021-08-20 DIAGNOSIS — D563 Thalassemia minor: Secondary | ICD-10-CM | POA: Insufficient documentation

## 2021-08-20 HISTORY — DX: Thalassemia minor: D56.3

## 2021-08-20 NOTE — Telephone Encounter (Signed)
[employer] screening results show patient is carrier for alpha-thalassemia. Recommends partner testing. Called patient; results and recommendation reviewed. Pt states she will not be able to keep next appt and is going to call office later today to reschedule. States she will come to office to pick up partner testing kit. ?

## 2021-08-24 NOTE — Telephone Encounter (Signed)
error 

## 2021-09-03 ENCOUNTER — Telehealth: Payer: Self-pay

## 2021-09-03 NOTE — Telephone Encounter (Addendum)
Left message that I am calling in regards to missed Centering appt and to follow up about referral records.  Please give the office a call back. ? ?09/04/21- Left message for pt to please call the office to schedule OB appt and follow up on referral records.   ? ?Shaman Muscarella,RN  ?09/03/21 ?

## 2021-09-18 ENCOUNTER — Ambulatory Visit: Payer: Medicaid Other

## 2021-10-02 ENCOUNTER — Ambulatory Visit: Payer: Medicaid Other | Attending: Maternal & Fetal Medicine

## 2021-10-02 ENCOUNTER — Ambulatory Visit: Payer: Medicaid Other | Admitting: *Deleted

## 2021-10-02 VITALS — BP 107/64 | HR 91

## 2021-10-02 DIAGNOSIS — E669 Obesity, unspecified: Secondary | ICD-10-CM

## 2021-10-02 DIAGNOSIS — Z3480 Encounter for supervision of other normal pregnancy, unspecified trimester: Secondary | ICD-10-CM

## 2021-10-02 DIAGNOSIS — O99212 Obesity complicating pregnancy, second trimester: Secondary | ICD-10-CM

## 2021-10-02 DIAGNOSIS — Z3A26 26 weeks gestation of pregnancy: Secondary | ICD-10-CM | POA: Insufficient documentation

## 2021-10-02 DIAGNOSIS — Z362 Encounter for other antenatal screening follow-up: Secondary | ICD-10-CM | POA: Insufficient documentation

## 2021-10-02 DIAGNOSIS — D369 Benign neoplasm, unspecified site: Secondary | ICD-10-CM | POA: Insufficient documentation

## 2021-10-03 ENCOUNTER — Other Ambulatory Visit: Payer: Self-pay | Admitting: *Deleted

## 2021-10-03 DIAGNOSIS — D279 Benign neoplasm of unspecified ovary: Secondary | ICD-10-CM

## 2021-10-03 DIAGNOSIS — O99212 Obesity complicating pregnancy, second trimester: Secondary | ICD-10-CM

## 2021-10-10 ENCOUNTER — Telehealth: Payer: Self-pay

## 2021-10-10 NOTE — Telephone Encounter (Signed)
Called pt in regards to her missed Centering appt.  Pt reports that she is unable to attend Centering as she needs afternoon appt due to transportation needs.  Pt scheduled on 10/16/21 @ 1515 to begin traditional ob appt.  Pt also informed to please make sure that she signs her ROI to obtain her neuro records for her referral.  Note also placed into appt notes. Centering appts canceled.  Pt verbalized understanding.  ? ?Gaye Scorza,RN  ?10/10/21 ?

## 2021-10-16 ENCOUNTER — Ambulatory Visit (INDEPENDENT_AMBULATORY_CARE_PROVIDER_SITE_OTHER): Payer: Medicaid Other | Admitting: Obstetrics and Gynecology

## 2021-10-16 VITALS — BP 104/63 | HR 104 | Wt 242.5 lb

## 2021-10-16 DIAGNOSIS — Z3480 Encounter for supervision of other normal pregnancy, unspecified trimester: Secondary | ICD-10-CM

## 2021-10-16 DIAGNOSIS — D563 Thalassemia minor: Secondary | ICD-10-CM

## 2021-10-16 DIAGNOSIS — Z3A28 28 weeks gestation of pregnancy: Secondary | ICD-10-CM

## 2021-10-16 DIAGNOSIS — D369 Benign neoplasm, unspecified site: Secondary | ICD-10-CM

## 2021-10-16 NOTE — Progress Notes (Signed)
   PRENATAL VISIT NOTE  Subjective:  Nichole [name] is a 21 y.o. G2P1001 at 80w0dbeing seen today for ongoing prenatal care.  She is currently monitored for the following issues for this low-risk pregnancy and has Supervision of other normal pregnancy, antepartum; Tumor; Eczema; Asthma, mild intermittent; Dermoid cyst; and Alpha thalassemia silent carrier on their problem list.  Patient doing well with no acute concerns today. She reports no complaints.  Contractions: Not present. Vag. Bleeding: None.  Movement: Present. Denies leaking of fluid.   The following portions of the patient's history were reviewed and updated as appropriate: allergies, current medications, past family history, past medical history, past social history, past surgical history and problem list. Problem list updated.  Objective:   Vitals:   10/16/21 1524  BP: 104/63  Pulse: (!) 104  Weight: 242 lb 8 oz (110 kg)    Fetal Status: Fetal Heart Rate (bpm): 140 Fundal Height: 28 cm Movement: Present     General:  Alert, oriented and cooperative. Patient is in no acute distress.  Skin: Skin is warm and dry. No rash noted.   Cardiovascular: Normal heart rate noted  Respiratory: Normal respiratory effort, no problems with respiration noted  Abdomen: Soft, gravid, appropriate for gestational age.  Pain/Pressure: Present     Pelvic: Cervical exam deferred        Extremities: Normal range of motion.  Edema: Trace  Mental Status:  Normal mood and affect. Normal behavior. Normal judgment and thought content.   Assessment and Plan:  Pregnancy: G2P1001 at 276w0d1. [redacted] weeks gestation of pregnancy   2. Supervision of other normal pregnancy, antepartum Continue routine care, pt interested in waterbirth, information given in chart  3. Alpha thalassemia silent carrier   4. Dermoid cyst Address after delivery  Preterm labor symptoms and general obstetric precautions including but not limited to vaginal bleeding,  contractions, leaking of fluid and fetal movement were reviewed in detail with the patient.  Please refer to After Visit Summary for other counseling recommendations.   Return in about 2 weeks (around 10/30/2021) [facility] 2 hr GTT, 3rd trim labs.   LaLynnda ShieldsMD Faculty Attending Center for [facility]

## 2021-10-16 NOTE — Patient Instructions (Signed)

## 2021-10-31 ENCOUNTER — Ambulatory Visit (INDEPENDENT_AMBULATORY_CARE_PROVIDER_SITE_OTHER): Payer: Medicaid Other | Admitting: Certified Nurse Midwife

## 2021-10-31 VITALS — BP 120/79 | HR 105 | Wt 250.0 lb

## 2021-10-31 DIAGNOSIS — D369 Benign neoplasm, unspecified site: Secondary | ICD-10-CM

## 2021-10-31 DIAGNOSIS — L299 Pruritus, unspecified: Secondary | ICD-10-CM

## 2021-10-31 DIAGNOSIS — Z3A30 30 weeks gestation of pregnancy: Secondary | ICD-10-CM

## 2021-10-31 DIAGNOSIS — O0993 Supervision of high risk pregnancy, unspecified, third trimester: Secondary | ICD-10-CM

## 2021-10-31 DIAGNOSIS — Z87898 Personal history of other specified conditions: Secondary | ICD-10-CM

## 2021-11-01 ENCOUNTER — Ambulatory Visit: Payer: Medicaid Other | Attending: Obstetrics

## 2021-11-01 ENCOUNTER — Ambulatory Visit: Payer: Medicaid Other | Admitting: *Deleted

## 2021-11-01 VITALS — BP 121/64 | HR 93

## 2021-11-01 DIAGNOSIS — O358XX0 Maternal care for other (suspected) fetal abnormality and damage, not applicable or unspecified: Secondary | ICD-10-CM | POA: Insufficient documentation

## 2021-11-01 DIAGNOSIS — O99891 Other specified diseases and conditions complicating pregnancy: Secondary | ICD-10-CM | POA: Diagnosis not present

## 2021-11-01 DIAGNOSIS — O3480 Maternal care for other abnormalities of pelvic organs, unspecified trimester: Secondary | ICD-10-CM | POA: Diagnosis not present

## 2021-11-01 DIAGNOSIS — Z3A30 30 weeks gestation of pregnancy: Secondary | ICD-10-CM

## 2021-11-01 DIAGNOSIS — O99212 Obesity complicating pregnancy, second trimester: Secondary | ICD-10-CM

## 2021-11-01 DIAGNOSIS — D270 Benign neoplasm of right ovary: Secondary | ICD-10-CM | POA: Diagnosis not present

## 2021-11-01 DIAGNOSIS — Z3480 Encounter for supervision of other normal pregnancy, unspecified trimester: Secondary | ICD-10-CM | POA: Insufficient documentation

## 2021-11-01 DIAGNOSIS — D279 Benign neoplasm of unspecified ovary: Secondary | ICD-10-CM | POA: Diagnosis not present

## 2021-11-01 DIAGNOSIS — O99213 Obesity complicating pregnancy, third trimester: Secondary | ICD-10-CM | POA: Diagnosis not present

## 2021-11-01 DIAGNOSIS — D369 Benign neoplasm, unspecified site: Secondary | ICD-10-CM | POA: Insufficient documentation

## 2021-11-01 DIAGNOSIS — D271 Benign neoplasm of left ovary: Secondary | ICD-10-CM | POA: Diagnosis not present

## 2021-11-01 DIAGNOSIS — E669 Obesity, unspecified: Secondary | ICD-10-CM

## 2021-11-01 NOTE — Progress Notes (Signed)
PRENATAL VISIT NOTE  Subjective:  Nichole [name] is a 21 y.o. G2P1001 at 41w2dbeing seen today for ongoing prenatal care.  She is currently monitored for the following issues for this high-risk pregnancy and has Supervision of other normal pregnancy, antepartum; Eczema; Asthma, mild intermittent; Dermoid cyst; Alpha thalassemia silent carrier; ADHD (attention deficit hyperactivity disorder); Endometriosis; and Hx of preeclampsia, prior pregnancy, currently pregnant on their problem list.  Patient reports  constant itching all over her body, only rash that comes up is welts AFTER she starts scratching. Has tried thick ointments and hydrocortisone cream, nothing works. Thicker creams seem to irritate her skin further .  Expresses concern that she's been seeing too many people and does not understand why [place] be [place] [facility] Was cleared by her neuro regarding her tumor (from 5th grade). Was supposed to get an MRI years ago because of daily headaches (following her surgery), but these have completely resolved. Now she gets only occasional headaches which always resolve on their own with rest, hydration, etc.   Contractions: Not present. Vag. Bleeding: None.  Movement: Present. Denies leaking of fluid.   The following portions of the patient's history were reviewed and updated as appropriate: allergies, current medications, past family history, past medical history, past social history, past surgical history and problem list.   Objective:   Vitals:   10/31/21 1422  BP: 120/79  Pulse: (!) 105  Weight: 250 lb (113.4 kg)    Fetal Status: Fetal Heart Rate (bpm): 142 Fundal Height: 30 cm Movement: Present     General:  Alert, oriented and cooperative. Patient is in no acute distress.  Skin: Skin is warm and dry. No rash noted.   Cardiovascular: Normal heart rate noted  Respiratory: Normal respiratory effort, no problems with respiration noted  Abdomen: Soft,  gravid, appropriate for gestational age.  Pain/Pressure: Present     Pelvic: Cervical exam deferred        Extremities: Normal range of motion.  Edema: Trace  Mental Status: Normal mood and affect. Normal behavior. Normal judgment and thought content.   Assessment and Plan:  Pregnancy: G2P1001 at 343w2d. Supervision of high risk pregnancy in third trimester - Doing well overall (aside from itching), feeling plenty of fetal movement  2. [redacted] weeks gestation of pregnancy - Routine OB care - Declined GTT as she had a "reaction" to it last time, her heart began racing, she nearly passed out and felt "horrible" all day. Assured her this could be due to the amount of simple carbs in the drink, pt still prefers not to do the testing and knows she will not do the 4x daily glucose testing reliably. - Can repeat A1C, get CBGs at visits - Pt is interested in waterbirth.  No contraindications at this time per chart review/patient assessment but counseled that signs of uncontrolled GDM will risk her out. - Pt to enroll in class, see CNMs for most visits in the office.  - Discussed waterbirth as option for low-risk pregnancy.  Reviewed conditions that may arise during pregnancy that will risk pt out of waterbirth including hypertension, diabetes, fetal growth restriction <10%ile, etc.  3. History of brain tumor - Reviewed records. Benign brain tumor >1036yrgo with no neuro symptoms at this time. Cleared by neuro from surgery, MRI unnecessary as severe headaches and migraines with aura have resolved.  4. Pruritis - Bile acids, total - Comp Met (CMET)  Preterm labor symptoms and general  obstetric precautions including but not limited to vaginal bleeding, contractions, leaking of fluid and fetal movement were reviewed in detail with the patient. Please refer to After Visit Summary for other counseling recommendations.   Return in about 2 weeks (around 11/14/2021) for IN-PERSON, [place].  Future Appointments   Date Time Provider [place]  11/21/2021  1:15 PM Clarnce Flock, MD [facility]  12/05/2021  3:15 PM Aletha Halim, MD [facility]  12/12/2021  3:35 PM Radene Gunning, MD [facility]  12/19/2021  3:35 PM Radene Gunning, MD [facility]  12/26/2021  2:15 PM Caren Macadam, MD [facility]  01/02/2022  2:15 PM Aletha Halim, MD [facility]  01/09/2022  1:15 PM [facility]  01/09/2022  2:15 PM Aletha Halim, MD [facility]    Gabriel Carina, CNM

## 2021-11-02 LAB — COMPREHENSIVE METABOLIC PANEL
ALT: 6 IU/L (ref 0–32)
AST: 10 IU/L (ref 0–40)
Albumin/Globulin Ratio: 1.2 (ref 1.2–2.2)
Albumin: 3.7 g/dL — ABNORMAL LOW (ref 3.9–5.0)
Alkaline Phosphatase: 83 IU/L (ref 42–106)
BUN/Creatinine Ratio: 13 (ref 9–23)
BUN: 8 mg/dL (ref 6–20)
Bilirubin Total: <0.2 mg/dL (ref 0.0–1.2)
CO2: 18 mmol/L — ABNORMAL LOW (ref 20–29)
Calcium: 8.7 mg/dL (ref 8.7–10.2)
Chloride: 104 mmol/L (ref 96–106)
Creatinine, Ser: 0.63 mg/dL (ref 0.57–1.00)
Globulin, Total: 3.2 g/dL (ref 1.5–4.5)
Glucose: 104 mg/dL — ABNORMAL HIGH (ref 70–99)
Potassium: 4.2 mmol/L (ref 3.5–5.2)
Sodium: 136 mmol/L (ref 134–144)
Total Protein: 6.9 g/dL (ref 6.0–8.5)
eGFR: 130 mL/min/{1.73_m2} (ref >59)

## 2021-11-02 LAB — BILE ACIDS, TOTAL: Bile Acids Total: 3.5 umol/L (ref 0.0–10.0)

## 2021-11-21 ENCOUNTER — Encounter: Payer: Self-pay | Admitting: Family Medicine

## 2021-11-21 ENCOUNTER — Ambulatory Visit (INDEPENDENT_AMBULATORY_CARE_PROVIDER_SITE_OTHER): Payer: Medicaid Other | Admitting: Family Medicine

## 2021-11-21 ENCOUNTER — Other Ambulatory Visit: Payer: Self-pay

## 2021-11-21 VITALS — BP 118/77 | HR 106 | Wt 253.0 lb

## 2021-11-21 DIAGNOSIS — Z3A33 33 weeks gestation of pregnancy: Secondary | ICD-10-CM

## 2021-11-21 DIAGNOSIS — Z3480 Encounter for supervision of other normal pregnancy, unspecified trimester: Secondary | ICD-10-CM

## 2021-11-21 DIAGNOSIS — O09299 Supervision of pregnancy with other poor reproductive or obstetric history, unspecified trimester: Secondary | ICD-10-CM

## 2021-11-21 DIAGNOSIS — D499 Neoplasm of unspecified behavior of unspecified site: Secondary | ICD-10-CM

## 2021-11-21 DIAGNOSIS — D369 Benign neoplasm, unspecified site: Secondary | ICD-10-CM

## 2021-11-21 MED ORDER — LORATADINE 10 MG PO TABS: 10.0000 mg | ORAL_TABLET | Freq: Every day | ORAL | 2 refills | Status: DC

## 2021-11-21 NOTE — Patient Instructions (Signed)

## 2021-11-21 NOTE — Progress Notes (Signed)
   Subjective:  Nichole [name] is a 21 y.o. G2P1001 at 71w1dbeing seen today for ongoing prenatal care.  She is currently monitored for the following issues for this low-risk pregnancy and has Supervision of other normal pregnancy, antepartum; Tumor; Eczema; Asthma, mild intermittent; Dermoid cyst; Alpha thalassemia silent carrier; ADHD (attention deficit hyperactivity disorder); Endometriosis; and Hx of preeclampsia, prior pregnancy, currently pregnant on their problem list.  Patient reports no complaints.  Contractions: Irritability. Vag. Bleeding: None.  Movement: Present. Denies leaking of fluid.   The following portions of the patient's history were reviewed and updated as appropriate: allergies, current medications, past family history, past medical history, past social history, past surgical history and problem list. Problem list updated.  Objective:   Vitals:   11/21/21 1322  BP: 118/77  Pulse: (!) 106  Weight: 253 lb (114.8 kg)    Fetal Status: Fetal Heart Rate (bpm): 128   Movement: Present     General:  Alert, oriented and cooperative. Patient is in no acute distress.  Skin: Skin is warm and dry. No rash noted.   Cardiovascular: Normal heart rate noted  Respiratory: Normal respiratory effort, no problems with respiration noted  Abdomen: Soft, gravid, appropriate for gestational age. Pain/Pressure: Absent     Pelvic: Vag. Bleeding: None     Cervical exam deferred        Extremities: Normal range of motion.     Mental Status: Normal mood and affect. Normal behavior. Normal judgment and thought content.   Urinalysis:      Assessment and Plan:  Pregnancy: G2P1001 at 394w1d1. Supervision of other normal pregnancy, antepartum BP and FHR normal Prior discussion had been to get A1c instead of GTT but not yet done, collected today Traumatic first birth experience for many reasons, long discussion Still having pruritus, trial claritin, multiple prior normal bile acids  2. Hx  of preeclampsia, prior pregnancy, currently pregnant Not on ASA, unclear why Prior chart reviewed, ruled in during labor, no severe features  3. Dermoid cyst Will need referral for removal PP  4. Tumor Extensive chart review Was benign growth found incidentally No further workup required  Preterm labor symptoms and general obstetric precautions including but not limited to vaginal bleeding, contractions, leaking of fluid and fetal movement were reviewed in detail with the patient. Please refer to After Visit Summary for other counseling recommendations.  Return in 2 weeks (on 12[facility]05/2021[facility]   EcClarnce FlockMD

## 2021-11-22 LAB — HEMOGLOBIN A1C
Est. average glucose Bld gHb Est-mCnc: 117 mg/dL
Hgb A1c MFr Bld: 5.7 % — ABNORMAL HIGH (ref 4.8–5.6)

## 2021-11-23 ENCOUNTER — Telehealth: Payer: Self-pay | Admitting: *Deleted

## 2021-11-23 DIAGNOSIS — O99810 Abnormal glucose complicating pregnancy: Secondary | ICD-10-CM

## 2021-11-23 NOTE — Telephone Encounter (Addendum)
-----   Message from Clarnce Flock, MD sent at 11/22/2021  4:16 PM EDT ----- Abnormal A1c Please call patient and let her know that I strongly recommend she do a 2hr GTT at this point  6/30  1100  Called pt and informed her of abnormal test result and message from Dr. Dione Plover. Pt voiced understanding and agreed to lab appt on 7/3 @ 0850.

## 2021-11-24 DIAGNOSIS — R7309 Other abnormal glucose: Secondary | ICD-10-CM | POA: Insufficient documentation

## 2021-11-24 HISTORY — DX: Other abnormal glucose: R73.09

## 2021-11-26 ENCOUNTER — Other Ambulatory Visit: Payer: Medicaid Other

## 2021-11-26 DIAGNOSIS — O99810 Abnormal glucose complicating pregnancy: Secondary | ICD-10-CM

## 2021-11-27 LAB — CBC
Hematocrit: 31.7 % — ABNORMAL LOW (ref 34.0–46.6)
Hemoglobin: 10.4 g/dL — ABNORMAL LOW (ref 11.1–15.9)
MCH: 25.2 pg — ABNORMAL LOW (ref 26.6–33.0)
MCHC: 32.8 g/dL (ref 31.5–35.7)
MCV: 77 fL — ABNORMAL LOW (ref 79–97)
Platelets: 333 10*3/uL (ref 150–450)
RBC: 4.13 x10E6/uL (ref 3.77–5.28)
RDW: 14.5 % (ref 11.7–15.4)
WBC: 8.7 10*3/uL (ref 3.4–10.8)

## 2021-11-27 LAB — GLUCOSE TOLERANCE, 2 HOURS W/ 1HR
Glucose, 1 hour: 124 mg/dL (ref 70–179)
Glucose, 2 hour: 97 mg/dL (ref 70–152)
Glucose, Fasting: 73 mg/dL (ref 70–91)

## 2021-11-27 LAB — RPR: RPR Ser Ql: NONREACTIVE

## 2021-11-27 LAB — HIV ANTIBODY (ROUTINE TESTING W REFLEX): HIV Screen 4th Generation wRfx: NONREACTIVE

## 2021-11-30 ENCOUNTER — Other Ambulatory Visit: Payer: Medicaid Other

## 2021-12-01 ENCOUNTER — Telehealth: Payer: Medicaid Other | Admitting: Nurse Practitioner

## 2021-12-01 ENCOUNTER — Inpatient Hospital Stay (HOSPITAL_COMMUNITY)
Admission: AD | Admit: 2021-12-01 | Discharge: 2021-12-01 | Disposition: A | Discharge status: Home / Self Care | Payer: Medicaid Other | Attending: Obstetrics & Gynecology | Admitting: Obstetrics & Gynecology

## 2021-12-01 ENCOUNTER — Encounter (HOSPITAL_COMMUNITY): Payer: Self-pay | Admitting: Obstetrics & Gynecology

## 2021-12-01 DIAGNOSIS — Z3A34 34 weeks gestation of pregnancy: Secondary | ICD-10-CM

## 2021-12-01 DIAGNOSIS — O4703 False labor before 37 completed weeks of gestation, third trimester: Secondary | ICD-10-CM

## 2021-12-01 DIAGNOSIS — O26893 Other specified pregnancy related conditions, third trimester: Secondary | ICD-10-CM

## 2021-12-01 DIAGNOSIS — M545 Low back pain, unspecified: Secondary | ICD-10-CM | POA: Diagnosis not present

## 2021-12-01 DIAGNOSIS — R350 Frequency of micturition: Secondary | ICD-10-CM

## 2021-12-01 LAB — URINALYSIS, ROUTINE W REFLEX MICROSCOPIC
Bacteria, UA: NONE SEEN
Bilirubin Urine: NEGATIVE
Glucose, UA: NEGATIVE mg/dL
Hgb urine dipstick: NEGATIVE
Ketones, ur: NEGATIVE mg/dL
Nitrite: NEGATIVE
Protein, ur: NEGATIVE mg/dL
Specific Gravity, Urine: 1.027 (ref 1.005–1.030)
pH: 6 (ref 5.0–8.0)

## 2021-12-01 LAB — WET PREP, GENITAL
Clue Cells Wet Prep HPF POC: NONE SEEN
Sperm: NONE SEEN
Trich, Wet Prep: NONE SEEN
WBC, Wet Prep HPF POC: <10 (ref <10)
Yeast Wet Prep HPF POC: NONE SEEN

## 2021-12-01 NOTE — MAU Provider Note (Signed)
CC:  Chief Complaint  Patient presents with   Contractions     Event Date/Time   First Provider Initiated Contact with Patient 12/01/21 2017      HPI: Nichole [name] is a 21 y.o. year old G29P1001 female at 46w4dweeks gestation who presents to MAU reporting 3 contractions per hour today and increased vaginal pressure.  Reports that she was out in the heat a lot yesterday.  History of full-term delivery.  Associated Sx: Positive for urinary frequency.  Negative for fever, chills, hematuria, dysuria, vaginal discharge. Vaginal bleeding: Denies Leaking of fluid: Denies Fetal movement: Normal  O: Patient Vitals for the past 24 hrs:  BP Temp Temp src Pulse Resp SpO2 Height Weight  12/01/21 1946 127/76 -- -- 94 -- -- -- --  12/01/21 1932 119/76 98.6 F (37 C) Oral 91 18 100 % '5\' 7"'$  (1.702 m) 118.1 kg    General: NAD Heart: Regular rate Lungs: Normal rate and effort Abd: Soft, NT, Gravid, S=D Pelvic: NEFG, negative leaking of fluid or blood  Close/thick/ballotable, presentation undetermined  EFM: 135, Moderate variability, 15 x 15 accelerations, no decelerations Toco: UI  Results Results for orders placed or performed during the hospital encounter of 12/01/21 (from the past 24 hour(s))  Urinalysis, Routine w reflex microscopic     Status: Abnormal   Collection Time: 12/01/21  7:27 PM  Result Value Ref Range   Color, Urine YELLOW YELLOW   APPearance HAZY (A) CLEAR   Specific Gravity, Urine 1.027 1.005 - 1.030   pH 6.0 5.0 - 8.0   Glucose, UA NEGATIVE NEGATIVE mg/dL   Hgb urine dipstick NEGATIVE NEGATIVE   Bilirubin Urine NEGATIVE NEGATIVE   Ketones, ur NEGATIVE NEGATIVE mg/dL   Protein, ur NEGATIVE NEGATIVE mg/dL   Nitrite NEGATIVE NEGATIVE   Leukocytes,Ua TRACE (A) NEGATIVE   RBC / HPF 0-5 0 - 5 RBC/hpf   WBC, UA 0-5 0 - 5 WBC/hpf   Bacteria, UA NONE SEEN NONE SEEN   Squamous Epithelial / LPF 0-5 0 - 5   Mucus PRESENT   Wet prep, genital     Status: None   Collection  Time: 12/01/21  8:30 PM   Specimen: PATH Cytology Cervicovaginal Ancillary Only  Result Value Ref Range   Yeast Wet Prep HPF POC NONE SEEN NONE SEEN   Trich, Wet Prep NONE SEEN NONE SEEN   Clue Cells Wet Prep HPF POC NONE SEEN NONE SEEN   WBC, Wet Prep HPF POC <10 <10   Sperm NONE SEEN      Orders Placed This Encounter  Procedures   Wet prep, genital   Urinalysis, Routine w reflex microscopic   P.o. hydration urine culture  MDM Preterm contractions without evidence of active preterm labor. Wet prep and UA Nml. Urine culture sent. GC/Chlamydia pending. Contractions likely exacerbated by dehydration. Improved w/ PO hydration.   A: 356w4deek IUP Braxton Hicks FHR reactive  P: Discharge home in stable condition. Preterm labor precautions and fetal kick counts. Increase fluids and rest Follow-up as scheduled for prenatal visit or sooner as needed if symptoms worsen. Return to maternity admissions as needed if symptoms worsen.  [place]ala [place]/12/2021 8:29 PM  3

## 2021-12-01 NOTE — Progress Notes (Signed)
Virtual Visit Consent   Nichole [name], you are scheduled for a virtual visit with Mary-Margaret Hassell Done, FNP, a [facility] provider, today.     Just as with appointments in the office, your consent must be obtained to participate.  Your consent will be active for this visit and any virtual visit you may have with one of our providers in the next 365 days.     If you have a MyChart account, a copy of this consent can be sent to you electronically.  All virtual visits are billed to your insurance company just like a traditional visit in the office.    As this is a virtual visit, video technology does not allow for your provider to perform a traditional examination.  This may limit your provider's ability to fully assess your condition.  If your provider identifies any concerns that need to be evaluated in person or the need to arrange testing (such as labs, EKG, etc.), we will make arrangements to do so.     Although advances in technology are sophisticated, we cannot ensure that it will always work on either your end or our end.  If the connection with a video visit is poor, the visit may have to be switched to a telephone visit.  With either a video or telephone visit, we are not always able to ensure that we have a secure connection.     I need to obtain your verbal consent now.   Are you willing to proceed with your visit today? YES   Nichole Heidemann has provided verbal consent on 12/01/2021 for a virtual visit (video or telephone).   Mary-Margaret Hassell Done, FNP   Date: 12/01/2021 3:06 PM   Virtual Visit via Video Note   I, Mary-Margaret Hassell Done, connected with Nichole Biller ([id number], August 29, 2000) on 12/01/21 at  3:15 PM EDT by a video-enabled telemedicine application and verified that I am speaking with the correct person using two identifiers.  Location: Patient: Virtual Visit Location Patient: Home Provider: Virtual Visit Location Provider: Mobile   I discussed the limitations of  evaluation and management by telemedicine and the availability of in person appointments. The patient expressed understanding and agreed to proceed.    History of Present Illness: Nichole [name] is a 21 y.o. who identifies as a female who was assigned female at birth, and is being seen today for pregnancy pain.  HPI: Patient states she is 21 months pregnant. She went for a long walk with her son yesterday and since then she has felt like the" baby is in her vagina". SHe says she is ot having consistent  or regular contractions, but the pressure in hr tail bone is excruciating.    Review of Systems  Constitutional:  Negative for diaphoresis and weight loss.  Eyes:  Negative for blurred vision, double vision and pain.  Respiratory:  Negative for shortness of breath.   Cardiovascular:  Negative for chest pain, palpitations, orthopnea and leg swelling.  Gastrointestinal:  Negative for abdominal pain.  Skin:  Negative for rash.  Neurological:  Negative for dizziness, sensory change, loss of consciousness, weakness and headaches.  Endo/Heme/Allergies:  Negative for polydipsia. Does not bruise/bleed easily.  Psychiatric/Behavioral:  Negative for memory loss. The patient does not have insomnia.   All other systems reviewed and are negative.   Problems:  Patient Active Problem List   Diagnosis Date Noted   Elevated hemoglobin A1c 11/24/2021   Alpha thalassemia silent carrier 08/20/2021   Dermoid cyst 08/15/2021  Eczema 07/27/2021   Asthma, mild intermittent 07/27/2021   Tumor 07/11/2021   Supervision of other normal pregnancy, antepartum 07/10/2021   Hx of preeclampsia, prior pregnancy, currently pregnant 10/03/2020   Endometriosis 06/07/2016   ADHD (attention deficit hyperactivity disorder) 07/17/2013    Allergies:  Allergies  Allergen Reactions   Peanut (Diagnostic) Hives   Medications:  Current Outpatient Medications:    albuterol (VENTOLIN HFA) 108 (90 Base) MCG/ACT inhaler,  Inhale 2 puffs into the lungs every 6 (six) hours as needed for wheezing or shortness of breath., Disp: 8 g, Rfl: 2   Blood Pressure Monitoring DEVI, 1 each by Does not apply route once a week. (Patient not taking: Reported on 07/27/2021), Disp: 1 each, Rfl: 0   hydrOXYzine (VISTARIL) 25 MG capsule, Take 1 capsule (25 mg total) by mouth 3 (three) times daily as needed., Disp: 30 capsule, Rfl: 0   loratadine (CLARITIN) 10 MG tablet, Take 1 tablet (10 mg total) by mouth daily., Disp: 30 tablet, Rfl: 2   Misc. Devices (GOJJI WEIGHT SCALE) MISC, 1 each by Does not apply route once a week. (Patient not taking: Reported on 07/27/2021), Disp: 1 each, Rfl: 0   Prenatal Vit-Fe Fumarate-FA (MULTIVITAMIN-PRENATAL) 27-0.8 MG TABS tablet, Take 1 tablet by mouth daily at 12 noon., Disp: , Rfl:   Observations/Objective: Patient is well-developed, well-nourished in no acute distress.  Resting comfortably  at home.  Head is normocephalic, atraumatic.  No labored breathing.  Speech is clear and coherent with logical content.  Patient is alert and oriented at baseline.    Assessment and Plan:  Nichole Tonne in today with chief complaint of pregnancy pain   1. Pregnancy related low back pain, antepartum, third trimester Patient did not know how to get in touch with her OB office. Phone number was given to her. I told her if she  could not reach any one that she would need to go to [facility] to be checked.      Follow Up Instructions: I discussed the assessment and treatment plan with the patient. The patient was provided an opportunity to ask questions and all were answered. The patient agreed with the plan and demonstrated an understanding of the instructions.  A copy of instructions were sent to the patient via MyChart.  The patient was advised to call back or seek an in-person evaluation if the symptoms worsen or if the condition fails to improve as anticipated.  Time:  I spent 10 minutes with  the patient via telehealth technology discussing the above problems/concerns.    Mary-Margaret Hassell Done, FNP

## 2021-12-01 NOTE — MAU Note (Signed)
.  Nichole [name] is a 21 y.o. at 69w4dhere in MAU reporting: Ctx in since yesterday that are not frequent, last hours had 3 UCs. Pt report she had done a lot of walking. Pt states she felt yesterday and today she felt the baby's head was in her lower vagina and feeling a lot of pressure in her lower ABD, pelvis, back and legs. Pt denies taking any pain medication. Pt denies DFM, VB, LOF, abnormal discharge, and recent intercourse. No other complications in the pregnancy.   Onset of complaint: yesterday Pain score: 7/10 Vitals:   12/01/21 1932  BP: 119/76  Pulse: 91  Resp: 18  Temp: 98.6 F (37 C)  SpO2: 100%     FHT:135 Lab orders placed from triage:  UA

## 2021-12-03 LAB — GC/CHLAMYDIA PROBE AMP (~~LOC~~) NOT AT ARMC
Chlamydia: NEGATIVE
Comment: NEGATIVE
Comment: NORMAL
Neisseria Gonorrhea: NEGATIVE

## 2021-12-03 LAB — CULTURE, OB URINE

## 2021-12-05 ENCOUNTER — Other Ambulatory Visit: Payer: Self-pay

## 2021-12-05 ENCOUNTER — Encounter: Payer: Self-pay | Admitting: Obstetrics and Gynecology

## 2021-12-05 ENCOUNTER — Ambulatory Visit (INDEPENDENT_AMBULATORY_CARE_PROVIDER_SITE_OTHER): Payer: Medicaid Other | Admitting: Obstetrics and Gynecology

## 2021-12-05 VITALS — BP 115/63 | HR 92 | Wt 257.8 lb

## 2021-12-05 DIAGNOSIS — O09893 Supervision of other high risk pregnancies, third trimester: Secondary | ICD-10-CM | POA: Insufficient documentation

## 2021-12-05 DIAGNOSIS — Z3483 Encounter for supervision of other normal pregnancy, third trimester: Secondary | ICD-10-CM

## 2021-12-05 DIAGNOSIS — Z3A35 35 weeks gestation of pregnancy: Secondary | ICD-10-CM

## 2021-12-05 DIAGNOSIS — Z6841 Body Mass Index (BMI) 40.0 and over, adult: Secondary | ICD-10-CM

## 2021-12-05 DIAGNOSIS — Z23 Encounter for immunization: Secondary | ICD-10-CM | POA: Diagnosis not present

## 2021-12-05 DIAGNOSIS — D279 Benign neoplasm of unspecified ovary: Secondary | ICD-10-CM

## 2021-12-05 DIAGNOSIS — O99210 Obesity complicating pregnancy, unspecified trimester: Secondary | ICD-10-CM | POA: Insufficient documentation

## 2021-12-05 DIAGNOSIS — Z3480 Encounter for supervision of other normal pregnancy, unspecified trimester: Secondary | ICD-10-CM

## 2021-12-05 DIAGNOSIS — O3480 Maternal care for other abnormalities of pelvic organs, unspecified trimester: Secondary | ICD-10-CM

## 2021-12-05 NOTE — Progress Notes (Unsigned)
   PRENATAL VISIT NOTE  Subjective:  Nichole [name] is a 21 y.o. G2P1001 at 105w1dbeing seen today for ongoing prenatal care.  She is currently monitored for the following issues for this high-risk pregnancy and has Supervision of other normal pregnancy, antepartum; History of benign brain tumor; Eczema; Asthma, mild intermittent; Dermoid cyst; Alpha thalassemia silent carrier; ADHD (attention deficit hyperactivity disorder); Endometriosis; Hx of preeclampsia, prior pregnancy, currently pregnant; Short interval between pregnancies affecting pregnancy in third trimester, antepartum; BMI 40s; and Obesity in pregnancy on their problem list.  Patient reports no complaints.  Contractions: Irritability. Vag. Bleeding: None.  Movement: Present. Denies leaking of fluid.   The following portions of the patient's history were reviewed and updated as appropriate: allergies, current medications, past family history, past medical history, past social history, past surgical history and problem list.   Objective:   Vitals:   12/05/21 1521  BP: 115/63  Pulse: 92  Weight: 257 lb 12.8 oz (116.9 kg)    Fetal Status: Fetal Heart Rate (bpm): 130 Fundal Height: 35 cm Movement: Present  Presentation: Vertex  General:  Alert, oriented and cooperative. Patient is in no acute distress.  Skin: Skin is warm and dry. No rash noted.   Cardiovascular: Normal heart rate noted  Respiratory: Normal respiratory effort, no problems with respiration noted  Abdomen: Soft, gravid, appropriate for gestational age.  Pain/Pressure: Present     Pelvic: Cervical exam deferred        Extremities: Normal range of motion.  Edema: None  Mental Status: Normal mood and affect. Normal behavior. Normal judgment and thought content.   Assessment and Plan:  Pregnancy: G2P1001 at [redacted]w[redacted]d. Short interval between pregnancies affecting pregnancy in third trimester, antepartum 09/2020 SVD  2. [redacted] weeks gestation of pregnancy GBS next  visit Has waterbirth class on 7/20. Request made for next visit to be with CNM  3. Dermoid cyst of ovary affecting pregnancy, antepartum 1-2cm bilaterally. D/w her that will recommend 6-12 week postpartum f/u u/s  4. BMI 40s  5. Obesity in pregnancy  Preterm labor symptoms and general obstetric precautions including but not limited to vaginal bleeding, contractions, leaking of fluid and fetal movement were reviewed in detail with the patient. Please refer to After Visit Summary for other counseling recommendations.   Return in about 1 week (around 12/12/2021).  Future Appointments  Date Time Provider De[place]7/19/2023  3:35 PM DuRadene GunningMD [facility]7/26/2023  3:35 PM DuRadene GunningMD [facility]8/06/2021  2:15 PM NeCaren MacadamMD [facility]8/01/2022  2:15 PM PiAletha HalimMD [facility]8/16/2023  1:15 PM [facility]8/16/2023  2:15 PM PiAletha HalimMD [facility]  ChAletha HalimMD

## 2021-12-10 DIAGNOSIS — F319 Bipolar disorder, unspecified: Secondary | ICD-10-CM

## 2021-12-10 DIAGNOSIS — F329 Major depressive disorder, single episode, unspecified: Secondary | ICD-10-CM

## 2021-12-10 DIAGNOSIS — B2790 Infectious mononucleosis, unspecified without complication: Principal | ICD-10-CM

## 2021-12-10 DIAGNOSIS — F32A Depression: Secondary | ICD-10-CM

## 2021-12-10 DIAGNOSIS — B9789 Other viral agents as the cause of diseases classified elsewhere: Secondary | ICD-10-CM

## 2021-12-10 DIAGNOSIS — M419 Scoliosis, unspecified: Secondary | ICD-10-CM

## 2021-12-10 DIAGNOSIS — F909 Attention-deficit hyperactivity disorder, unspecified type: Principal | ICD-10-CM

## 2021-12-10 DIAGNOSIS — J028 Acute pharyngitis due to other specified organisms: Secondary | ICD-10-CM

## 2021-12-10 DIAGNOSIS — J45909 Unspecified asthma, uncomplicated: Secondary | ICD-10-CM

## 2021-12-10 DIAGNOSIS — G4700 Insomnia, unspecified: Secondary | ICD-10-CM

## 2021-12-10 DIAGNOSIS — A64 Unspecified sexually transmitted disease: Secondary | ICD-10-CM

## 2021-12-10 DIAGNOSIS — Z9109 Other allergy status, other than to drugs and biological substances: Secondary | ICD-10-CM

## 2021-12-10 DIAGNOSIS — F4310 Post-traumatic stress disorder, unspecified: Secondary | ICD-10-CM

## 2021-12-10 MED ORDER — ONDANSETRON 4 MG PO TBDP
4 mg | Freq: Once | ORAL | Status: CP
Start: 2021-12-10 — End: ?

## 2021-12-10 MED ORDER — IBUPROFEN 800 MG PO TABS
800 mg | Freq: Once | ORAL | Status: DC
Start: 2021-12-10 — End: 2021-12-11

## 2021-12-11 ENCOUNTER — Inpatient Hospital Stay: Admit: 2021-12-11 | Payer: Medicaid Other

## 2021-12-11 ENCOUNTER — Emergency Department: Admit: 2021-12-11 | Discharge: 2021-12-11 | Payer: Medicaid Other

## 2021-12-11 ENCOUNTER — Emergency Department: Admit: 2021-12-11 | Payer: Medicaid Other

## 2021-12-11 MED ORDER — DEXAMETHASONE 4 MG PO TABS
8 mg | Freq: Once | ORAL | 0 refills | 5.00000 days | Status: CP
Start: 2021-12-11 — End: ?

## 2021-12-11 MED ORDER — DEXAMETHASONE SOD PHOSPHATE PF 10 MG/ML IJ SOLN
10 mg | Freq: Once | ORAL | Status: CP
Start: 2021-12-11 — End: ?

## 2021-12-12 ENCOUNTER — Encounter: Payer: Medicaid Other | Admitting: Obstetrics and Gynecology

## 2021-12-14 ENCOUNTER — Ambulatory Visit (INDEPENDENT_AMBULATORY_CARE_PROVIDER_SITE_OTHER): Payer: Medicaid Other | Admitting: Certified Nurse Midwife

## 2021-12-14 ENCOUNTER — Other Ambulatory Visit: Payer: Self-pay

## 2021-12-14 ENCOUNTER — Other Ambulatory Visit (HOSPITAL_COMMUNITY)
Admission: RE | Admit: 2021-12-14 | Discharge: 2021-12-14 | Disposition: A | Discharge status: Home / Self Care | Payer: Medicaid Other | Source: Ambulatory Visit | Attending: Obstetrics and Gynecology | Admitting: Obstetrics and Gynecology

## 2021-12-14 VITALS — BP 128/81 | HR 119 | Wt 263.9 lb

## 2021-12-14 DIAGNOSIS — Z3A36 36 weeks gestation of pregnancy: Secondary | ICD-10-CM

## 2021-12-14 DIAGNOSIS — O26893 Other specified pregnancy related conditions, third trimester: Secondary | ICD-10-CM | POA: Diagnosis present

## 2021-12-14 DIAGNOSIS — D369 Benign neoplasm, unspecified site: Secondary | ICD-10-CM

## 2021-12-14 DIAGNOSIS — N898 Other specified noninflammatory disorders of vagina: Secondary | ICD-10-CM | POA: Diagnosis present

## 2021-12-14 DIAGNOSIS — O0993 Supervision of high risk pregnancy, unspecified, third trimester: Secondary | ICD-10-CM

## 2021-12-14 DIAGNOSIS — O09893 Supervision of other high risk pregnancies, third trimester: Secondary | ICD-10-CM

## 2021-12-14 NOTE — Progress Notes (Unsigned)
   PRENATAL VISIT NOTE  Subjective:  Nichole [name] is a 21 y.o. G2P1001 at 19w3dbeing seen today for ongoing prenatal care.  She is currently monitored for the following issues for this high-risk pregnancy and has Supervision of other normal pregnancy, antepartum; History of benign brain tumor; Eczema; Asthma, mild intermittent; Dermoid cyst; Alpha thalassemia silent carrier; ADHD (attention deficit hyperactivity disorder); Endometriosis; Hx of preeclampsia, prior pregnancy, currently pregnant; Short interval between pregnancies affecting pregnancy in third trimester, antepartum; BMI 40s; and Obesity in pregnancy on their problem list.  Patient reports leaking of fluid.Patient states she felt a gush of fluid yesterday during her waterbirth class denies continuous leaking and Denies wearing a pad.  Contractions: Irritability. Vag. Bleeding: None.  Movement: Present.   The following portions of the patient's history were reviewed and updated as appropriate: allergies, current medications, past family history, past medical history, past social history, past surgical history and problem list.   Objective:   Vitals:   12/14/21 1145  BP: 128/81  Pulse: (!) 119  Weight: 263 lb 14.4 oz (119.7 kg)    Fetal Status: Fetal Heart Rate (bpm): 151 Fundal Height: 37 cm Movement: Present     General:  Alert, oriented and cooperative. Patient is in no acute distress.  Skin: Skin is warm and dry. No rash noted.   Cardiovascular: Normal heart rate noted  Respiratory: Normal respiratory effort, no problems with respiration noted  Abdomen: Soft, gravid, appropriate for gestational age.  Pain/Pressure: Present     Pelvic: Cervical exam deferred        Extremities: Normal range of motion.     Mental Status: Normal mood and affect. Normal behavior. Normal judgment and thought content.   Assessment and Plan:  Pregnancy: G2P1001 at 356w3d. Supervision of high risk pregnancy in third trimester - Patient  feeling frequent and vigorous fetal movement. - D/t previous traumatic experience with 1st pregnancy patient desires waterbirth, but it open to epidural management if needed. Patient desires options during labor and birth. Briefly reviewed risks involving risking out of WB as well as safety and staffing concerns when comes time to labor. Patient understood and is very reasonable.  - Discussed change in prenatal schedule from bi-weekly to weekly visits until delivery.    2. [redacted] weeks gestation of pregnancy - Hx of PreE w/ SF. BPs normotensive today.  - Maryann Alarlide collected today- NEGATIVE  - Wet Prep collected.- PENDING   3. Short interval between pregnancies affecting pregnancy in third trimester, antepartum - Proven pelvis up to 8lbs.   4. Dermoid cyst - Follow up Postpartum   Preterm labor symptoms and general obstetric precautions including but not limited to vaginal bleeding, contractions, leaking of fluid and fetal movement were reviewed in detail with the patient. Please refer to After Visit Summary for other counseling recommendations.   Return in about 1 week (around 12/21/2021) for HROB.  Future Appointments  Date Time Provider DeSummerland7/26/2023  3:35 PM DuRadene GunningMD WMWest [facility] Pa[facility]8/06/2021  2:15 PM NeCaren MacadamMD WMBaptist [facility]ounMP[facility]8/01/2022  3:15 PM PiAletha HalimMD WMNorth [facility]CM[facility]8/16/2023  1:15 PM WMC-WOCA NST WMR[facility]8/16/2023  2:15 PM PiAletha HalimMD WMPiedmont [facility] CenterMK[facility]  Anastassia Noack (SIsaias SakaiPaRollene RotundaMSN, CNTivolior WoBolt07/22/23 12:44 PM

## 2021-12-17 LAB — CERVICOVAGINAL ANCILLARY ONLY
Bacterial Vaginitis (gardnerella): NEGATIVE
Candida Glabrata: NEGATIVE
Candida Vaginitis: POSITIVE — AB
Chlamydia: NEGATIVE
Comment: NEGATIVE
Comment: NEGATIVE
Comment: NEGATIVE
Comment: NEGATIVE
Comment: NEGATIVE
Comment: NORMAL
Neisseria Gonorrhea: NEGATIVE
Trichomonas: NEGATIVE

## 2021-12-17 MED ORDER — TERCONAZOLE 0.4 % VA CREA: 1.0000 | TOPICAL_CREAM | Freq: Every day | VAGINAL | 0 refills | Status: DC

## 2021-12-17 NOTE — Addendum Note (Signed)
Addended by: Deloris Ping on: 12/17/2021 03:46 PM   Modules accepted: Orders

## 2021-12-17 NOTE — Progress Notes (Signed)
OBSTETRICS PRENATAL VIRTUAL VISIT ENCOUNTER NOTE  Provider location: [facility] [place] [facility] [place] [facility]   Patient location: Home  I connected with Nichole [name] on 12/19/21 at  3:35 PM EDT by MyChart Video Encounter and verified that I am speaking with the correct person using two identifiers. I discussed the limitations, risks, security and privacy concerns of performing an evaluation and management service virtually and the availability of in person appointments. I also discussed with the patient that there may be a patient responsible charge related to this service. The patient expressed understanding and agreed to proceed. Subjective:  Nichole [name] is a 21 y.o. G2P1001 at 35w1dbeing seen today for ongoing prenatal care.  She is currently monitored for the following issues for this high-risk pregnancy and has Supervision of other normal pregnancy, antepartum; History of benign brain tumor; Eczema; Asthma, mild intermittent; Dermoid cyst; Alpha thalassemia silent carrier; ADHD (attention deficit hyperactivity disorder); Endometriosis; Hx of preeclampsia, prior pregnancy, currently pregnant; Short interval between pregnancies affecting pregnancy in third trimester, antepartum; BMI 40s; and Obesity in pregnancy on their problem list.  Patient reports no complaints.  Contractions: Irritability. Vag. Bleeding: None.  Movement: Present. Denies any leaking of fluid.   The following portions of the patient's history were reviewed and updated as appropriate: allergies, current medications, past family history, past medical history, past social history, past surgical history and problem list.   Objective:  There were no vitals filed for this visit.  Fetal Status:     Movement: Present     General:  Alert, oriented and cooperative. Patient is in no acute distress.  Respiratory: Normal respiratory effort, no problems with respiration noted  Mental Status: Normal mood and affect.  Normal behavior. Normal judgment and thought content.  Rest of physical exam deferred due to type of encounter  Imaging: No results found.  Assessment and Plan:  Pregnancy: G2P1001 at 368w1d. Supervision of other normal pregnancy, antepartum Needs in person appt next time for GBS swab Has h/o PPD - would like IBH referral and PCP referral. Put in today.  Has nausea - will try tums and if doesn't help or nausea getting worse, she will be evaluated.   2. Dermoid cyst Will need PP follow up and then plan of care after   3. Hx of preeclampsia, prior pregnancy, currently pregnant  4. Short interval between pregnancies affecting pregnancy in third trimester, antepartum Normal growth (24%ile, normal AC and AFI) on 6/8 - no follow up needed unless indicated clinically  5. Obesity in pregnancy  6. Mild intermittent asthma without complication Doesn't use inhaler  Term labor symptoms and general obstetric precautions including but not limited to vaginal bleeding, contractions, leaking of fluid and fetal movement were reviewed in detail with the patient. I discussed the assessment and treatment plan with the patient. The patient was provided an opportunity to ask questions and all were answered. The patient agreed with the plan and demonstrated an understanding of the instructions. The patient was advised to call back or seek an in-person office evaluation/[facility] any urgent or concerning symptoms. Please refer to After Visit Summary for other counseling recommendations.   I provided 39 minutes of face-to-face time during this encounter.  Return in about 1 week (around 12/26/2021) for In person - needs GBS.  Future Appointments  Date Time Provider DeAbbeville8/06/2021  2:15 PM NeCaren MacadamMD WMAssurance Health [facility]CMC[facility]8/01/2022  3:35 PM  Aletha Halim, MD [facility]  01/09/2022  1:15 PM W[facility] NST [facility]  01/09/2022  2:15 PM Aletha Halim, MD [facility]    Radene Gunning, MD [facility] for [employer], [place]

## 2021-12-18 ENCOUNTER — Telehealth: Payer: Self-pay | Admitting: Lactation Services

## 2021-12-18 NOTE — Telephone Encounter (Signed)
Called patient with results of vaginal swab. She did not answer. LM that message was sent to a pharmacy and that will send My Chart message.

## 2021-12-18 NOTE — Telephone Encounter (Signed)
-----   Message from Deloris Ping, [place] sent at 12/17/2021  3:46 PM EDT ----- Patient has a yeast infection. Sent Rx to pharmacy.

## 2021-12-19 ENCOUNTER — Encounter: Payer: Self-pay | Admitting: Obstetrics and Gynecology

## 2021-12-19 ENCOUNTER — Telehealth (INDEPENDENT_AMBULATORY_CARE_PROVIDER_SITE_OTHER): Payer: Medicaid Other | Admitting: Obstetrics and Gynecology

## 2021-12-19 DIAGNOSIS — Z3A37 37 weeks gestation of pregnancy: Secondary | ICD-10-CM

## 2021-12-19 DIAGNOSIS — Z8759 Personal history of other complications of pregnancy, childbirth and the puerperium: Secondary | ICD-10-CM

## 2021-12-19 DIAGNOSIS — O09299 Supervision of pregnancy with other poor reproductive or obstetric history, unspecified trimester: Secondary | ICD-10-CM

## 2021-12-19 DIAGNOSIS — O99213 Obesity complicating pregnancy, third trimester: Secondary | ICD-10-CM

## 2021-12-19 DIAGNOSIS — D369 Benign neoplasm, unspecified site: Secondary | ICD-10-CM

## 2021-12-19 DIAGNOSIS — Z8659 Personal history of other mental and behavioral disorders: Secondary | ICD-10-CM

## 2021-12-19 DIAGNOSIS — O99210 Obesity complicating pregnancy, unspecified trimester: Secondary | ICD-10-CM

## 2021-12-19 DIAGNOSIS — O09893 Supervision of other high risk pregnancies, third trimester: Secondary | ICD-10-CM

## 2021-12-19 DIAGNOSIS — O99513 Diseases of the respiratory system complicating pregnancy, third trimester: Secondary | ICD-10-CM

## 2021-12-19 DIAGNOSIS — Z3480 Encounter for supervision of other normal pregnancy, unspecified trimester: Secondary | ICD-10-CM

## 2021-12-19 DIAGNOSIS — J4520 Mild intermittent asthma, uncomplicated: Secondary | ICD-10-CM

## 2021-12-19 DIAGNOSIS — O99891 Other specified diseases and conditions complicating pregnancy: Secondary | ICD-10-CM

## 2021-12-19 DIAGNOSIS — O09293 Supervision of pregnancy with other poor reproductive or obstetric history, third trimester: Secondary | ICD-10-CM

## 2021-12-19 NOTE — Addendum Note (Signed)
Addended by: Shelly Coss on: 12/19/2021 04:13 PM   Modules accepted: Orders

## 2021-12-20 NOTE — BH Specialist Note (Signed)
Integrated Behavioral Health via Telemedicine Visit  12/31/2021 Nichole [name] Noguchi [id number]  Number of Nichole [name] visits: 1- Initial Visit  Session Start time: 1202   Session End time: 5009  Total time in minutes: 53   Referring Provider: Radene Gunning, MD Patient/Family location: Home [facility] Provider location: [facility] [place] [facility]  All persons participating in visit: Patient Nichole [name] and [name]   Types of Service: Individual psychotherapy and Video visit  I connected with Nichole [name] Perrott and/or Nichole [name] Fullington's  n/a  via  Telephone or Geologist, engineering  (Video is Tree surgeon) and verified that I am speaking with the correct person using two identifiers. Discussed confidentiality: Yes   I discussed the limitations of telemedicine and the availability of in person appointments.  Discussed there is a possibility of technology failure and discussed alternative modes of communication if that failure occurs.  I discussed that engaging in this telemedicine visit, they consent to the provision of behavioral healthcare and the services will be billed under their insurance.  Patient and/or legal guardian expressed understanding and consented to Telemedicine visit: Yes   Presenting Concerns: Patient and/or family reports the following symptoms/concerns: history of increased mood and anxiety issues with previous pregnancy; goal is to have better experience with upcoming childbirth/postpartum time; open to implementing relaxation strategy to help manage early labor and feel more calm last week before due date.  Duration of problem: Current pregnancy; Severity of problem: mild  Patient and/or Family's Strengths/Protective Factors: Social connections, Concrete supports in place (healthy food, safe environments, etc.), Sense of purpose, and Physical Health (exercise, healthy diet, medication  compliance, etc.)  Goals Addressed: Patient will:  Reduce symptoms of: anxiety and depression   Increase knowledge and/or ability of: self-management skills   Demonstrate ability to: Increase healthy adjustment to current life circumstances  Progress towards Goals: Ongoing  Interventions: Interventions utilized:  Mindfulness or Psychologist, educational, Functional Assessment of ADLs, and Psychoeducation and/or Health Education Standardized Assessments completed: Not Needed  Patient and/or Family Response: Patient agrees with treatment plan.   Assessment: Patient currently experiencing Adjustment disorder with mixed anxious and depressed mood.   Patient may benefit from psychoeducation and brief therapeutic interventions regarding coping with symptoms of anxiety and depression .  Plan: Follow up with behavioral health [name] on : Three weeks; Call Porfirio Bollier at [phone], as needed. Behavioral recommendations:  -Continue taking prenatal vitamin as prescribed -Continue daily walks prior to childbirth, for as long as remains helpful CALM relaxation breathing exercise twice daily (morning; at bedtime with animated movie); as needed throughout the day -Continue prioritizing healthy self-care (regular meals, adequate rest; allowing practical help from supportive friends and family)  -Consider new mom support group as needed at either [email or web address].net or [email or web address]  -Consider additional information on After Visit Summary, as needed  Referral(s): [place] (In Clinic)  I discussed the assessment and treatment plan with the patient and/or parent/guardian. They were provided an opportunity to ask questions and all were answered. They agreed with the plan and demonstrated an understanding of the instructions.   They were advised to call back or seek an in-person evaluation if the symptoms worsen or if the condition fails to improve as  anticipated.  Crook, LCSW     12/26/2021    2:28 PM 12/05/2021    3:24 PM 11/21/2021    2:09 PM 08/06/2021   12:38 PM 07/27/2021   10:51  AM  Depression screen PHQ 2/9  Decreased Interest '1 1 1 1 1  '$ Down, Depressed, Hopeless '1 1 1 '$ 0 0  PHQ - 2 Score '2 2 2 1 1  '$ Altered sleeping '1 1 1 3 2  '$ Tired, decreased energy '1 1 1 2 1  '$ Change in appetite '1 1 1 1 2  '$ Feeling bad or failure about yourself  1 0 0 0 0  Trouble concentrating '1 1 1 1 1  '$ Moving slowly or fidgety/restless '1 1 1 2 1  '$ Suicidal thoughts 0 0 0 0 0  PHQ-9 Score '8 7 7 10 8      '$ 12/26/2021    2:29 PM 12/05/2021    3:24 PM 11/21/2021    2:09 PM 08/06/2021   12:38 PM  GAD 7 : Generalized Anxiety Score  Nervous, Anxious, on Edge '1 1 1 2  '$ Control/stop worrying 1 1 0 2  Worry too much - different things 1 1 0 2  Trouble relaxing '1 1 1 2  '$ Restless 1 0 1 1  Easily annoyed or irritable '1 1 1 2  '$ Afraid - awful might happen 1 1 0 0  Total GAD 7 Score '7 6 4 '$ 11

## 2021-12-26 ENCOUNTER — Ambulatory Visit (INDEPENDENT_AMBULATORY_CARE_PROVIDER_SITE_OTHER): Payer: Medicaid Other | Admitting: Family Medicine

## 2021-12-26 ENCOUNTER — Other Ambulatory Visit: Payer: Self-pay

## 2021-12-26 VITALS — BP 116/66 | HR 90 | Wt 264.0 lb

## 2021-12-26 DIAGNOSIS — O09893 Supervision of other high risk pregnancies, third trimester: Secondary | ICD-10-CM

## 2021-12-26 DIAGNOSIS — Z3480 Encounter for supervision of other normal pregnancy, unspecified trimester: Secondary | ICD-10-CM

## 2021-12-26 DIAGNOSIS — O09299 Supervision of pregnancy with other poor reproductive or obstetric history, unspecified trimester: Secondary | ICD-10-CM

## 2021-12-26 DIAGNOSIS — O99210 Obesity complicating pregnancy, unspecified trimester: Secondary | ICD-10-CM

## 2021-12-26 NOTE — Patient Instructions (Signed)
These supplements and herbs are available over the counter without a prescription. They are often in the vitamin section of a pharmacy.   For pregnancy Blood Builder Magnesium - get Calm drink or gummies __________________________________________________________________________________________  To help ripen your Cervix/prepare the uterus (to get your cervix ready for labor):   Red Raspberry Leaf capsules:  two '300mg'$  or '400mg'$  tablets with each meal, 2-3 times a day  Potential Side Effects Of Raspberry Leaf:  Most women do not experience any side effects from drinking raspberry leaf tea. However, nausea and loose stools are possible. This can cause uterine irritability. If you notice having many braxton hicks contractions, stop this supplement.    Evening Primrose Oil capsules: may take 1 to 3 capsules daily. May also prick one to release the oil and insert it into your vagina at night.  One regimen would be to take 1 tablets three times per day and place 2 tablets in the vagina at night.   Some of the potential side effects:  Upset stomach  Loose stools or diarrhea  Headaches  Nausea   _____________________________________________________________________________________________  For Labor: All of these can be use in the last trimester of pregnancy  5-6 Dates a day -- This can help shorten your labor (may taste better if warmed in microwave until soft). This can also be combined with almond butter, any other nut butter, or wrap in bacon and bake, or toss in smoothies. Can also eat [place] bars. Found where raisins are in the grocery store   ______________________________________________________________________________________________  For Breastfeeding:   NOTHING WILL WORK UNLESS YOU ARE  - Emptying the breast adequately/completely - Emptying the breast regularly  Supplement/Herb Purpose Dose Side Effects  Vitamin D Increase Vitamin D to infant, recommended for all breastfeeding  moms and can use instead of supplementing infant 4000 IU daily None  Fenugreek** use with extreme caution as this can decrease milk supply in some  Increases prolactin  '400mg'$  TID (max) Nausea, Loose stools and smelling like maple syrup  Goat's Rue Help increase differentiation of breast tissue, good for women with suspected IGT. Helps with insulin sensitvity 1 capsule BID Nausea, loose stools  Legendairy Milk Supplements -PumpPrincess -Liquid Gold -Cash Cow -Milkapalooza  Combination of herbal galactogues  Per packaging Per packaging  Lactation cookies/bars Food based galactogues/increase maternal calories All the time, q2-3 hours    Flax seeds Food based Galactogue Daily GI upset, nausea  Brewer's yeast Food based Galactogue Daily GI upset, nausea  Hemp Hearts Food based Galactogue Daily GI upset, nausea  Oats Food based Galactogue Daily   Lecithin (Soy or Sunflower) Decreases the viscosity of milk, galactogue, fat emulsifier  Good for oversupply mothers to prevent clogged ducts '1200mg'$  three times per day GI upset  Rehydration drinks (Gatorade/Powerade) Improves maternal hydration and mammary glands are histologically similar to sweat glands  None, caution use in patients with T2DM

## 2021-12-26 NOTE — Progress Notes (Signed)
PHQ9 GAD7 scores were addressed. Pt already has appointment with [facility] 12/31/21

## 2021-12-26 NOTE — Progress Notes (Signed)
   PRENATAL VISIT NOTE  Subjective:  Nichole [name] is a 21 y.o. G2P1001 at 66w1dbeing seen today for ongoing prenatal care.  She is currently monitored for the following issues for this high-risk pregnancy and has Supervision of other normal pregnancy, antepartum; History of benign brain tumor; Eczema; Asthma, mild intermittent; Dermoid cyst; Alpha thalassemia silent carrier; ADHD (attention deficit hyperactivity disorder); Endometriosis; Hx of preeclampsia, prior pregnancy, currently pregnant; Short interval between pregnancies affecting pregnancy in third trimester, antepartum; BMI 40s; and Obesity in pregnancy on their problem list.  Patient reports  cramping and contractions past week. Reports her pelvis feel loose. Reported some leg weakness as well.  .  Contractions: Irritability. Vag. Bleeding: None.  Movement: Present. Denies leaking of fluid.   The following portions of the patient's history were reviewed and updated as appropriate: allergies, current medications, past family history, past medical history, past social history, past surgical history and problem list.   Objective:   Vitals:   12/26/21 1430  BP: 116/66  Pulse: 90  Weight: 264 lb (119.7 kg)    Fetal Status: Fetal Heart Rate (bpm): 141 Fundal Height: 38 cm Movement: Present  Presentation: Vertex  General:  Alert, oriented and cooperative. Patient is in no acute distress.  Skin: Skin is warm and dry. No rash noted.   Cardiovascular: Normal heart rate noted  Respiratory: Normal respiratory effort, no problems with respiration noted  Abdomen: Soft, gravid, appropriate for gestational age.  Pain/Pressure: Present     Pelvic: Cervical exam performed in the presence of a chaperone Dilation: 1 Effacement (%): 50 Station: -3  Extremities: Normal range of motion.     Mental Status: Normal mood and affect. Normal behavior. Normal judgment and thought content.   Assessment and Plan:  Pregnancy: G2P1001 at 398w1d.  Supervision of other normal pregnancy, antepartum Having ctx and cramping for the past week. Vigorous movement but has noted slightly less this past week Reports period like cramping and also pelvic pressure Reports anxiety is worsening a little at the end of pregnancy Confirmed her continue desire for WB--reviewed coping strategies. She reports she did take the class and has two documented visits with WB providers to discuss process.  Informed I too am a WB provider. Patient is open to other options for coping like epidural - Culture, beta strep (group b only)  2. Obesity in pregnancy TWG=64 lb (29 kg) which is well above goal  3. Short interval between pregnancies affecting pregnancy in third trimester, antepartum Has 1 year  4. Hx of PEC - BP is WNL today  Term labor symptoms and general obstetric precautions including but not limited to vaginal bleeding, contractions, leaking of fluid and fetal movement were reviewed in detail with the patient. Please refer to After Visit Summary for other counseling recommendations.   Return in about 1 week (around 01/02/2022) for Routine prenatal care.  Future Appointments  Date Time Provider DeBranson West8/11/2021 12:00 PM WMPeostaMMil[facility]8/01/2022  3:55 PM PiAletha HalimMD WMThe [facility] Clinic Asc [facility] GroupMF[facility]8/16/2023  1:15 PM WMC-WOCA NST WMNorth Tampa Behavioral HealthMM[facility]8/16/2023  2:15 PM PiAletha HalimMD WMGeorgia Spine Surgery [facility] CenterMM[facility]  KiCaren MacadamMD

## 2021-12-28 ENCOUNTER — Encounter (HOSPITAL_COMMUNITY): Payer: Self-pay | Admitting: Obstetrics and Gynecology

## 2021-12-28 ENCOUNTER — Other Ambulatory Visit: Payer: Self-pay

## 2021-12-28 ENCOUNTER — Inpatient Hospital Stay (HOSPITAL_COMMUNITY)
Admission: AD | Admit: 2021-12-28 | Discharge: 2021-12-28 | Disposition: A | Discharge status: Home / Self Care | Payer: Medicaid Other | Attending: Obstetrics and Gynecology | Admitting: Obstetrics and Gynecology

## 2021-12-28 ENCOUNTER — Encounter: Payer: Self-pay | Admitting: Family Medicine

## 2021-12-28 DIAGNOSIS — D369 Benign neoplasm, unspecified site: Secondary | ICD-10-CM

## 2021-12-28 DIAGNOSIS — Z3A38 38 weeks gestation of pregnancy: Secondary | ICD-10-CM | POA: Insufficient documentation

## 2021-12-28 DIAGNOSIS — Z3480 Encounter for supervision of other normal pregnancy, unspecified trimester: Secondary | ICD-10-CM

## 2021-12-28 DIAGNOSIS — Z0371 Encounter for suspected problem with amniotic cavity and membrane ruled out: Secondary | ICD-10-CM | POA: Diagnosis not present

## 2021-12-28 DIAGNOSIS — O26893 Other specified pregnancy related conditions, third trimester: Secondary | ICD-10-CM

## 2021-12-28 LAB — WET PREP, GENITAL
Clue Cells Wet Prep HPF POC: NONE SEEN
Sperm: NONE SEEN
Trich, Wet Prep: NONE SEEN
WBC, Wet Prep HPF POC: <10 (ref <10)
Yeast Wet Prep HPF POC: NONE SEEN

## 2021-12-28 LAB — POCT FERN TEST: POCT Fern Test: NEGATIVE

## 2021-12-28 LAB — AMNISURE RUPTURE OF MEMBRANE (ROM) NOT AT ARMC: Amnisure ROM: NEGATIVE

## 2021-12-28 NOTE — MAU Note (Signed)
Nichole [name] is a 21 y.o. at 30w3dhere in MAU reporting: LOF @ 0500 this morning, reports fluid clear.  Denies VB.  Endorses +FM.  Onset of complaint: today Pain score: 7 Vitals:   12/28/21 1331  BP: 136/69  Pulse: 96  Resp: 18  Temp: 97.9 F (36.6 C)  SpO2: 98%     FHT: 133 bpm Lab orders placed from triage:   NOne

## 2021-12-28 NOTE — MAU Provider Note (Signed)
S: Ms. Nichole [name] is a 21 y.o. G2P1001 at [redacted]w[redacted]d who presents to MAU today complaining of leaking of fluid since 0500. She denies vaginal bleeding. She endorses contractions. She reports normal fetal movement.    O: BP 136/69 (BP Location: Right Arm)   Pulse 96   Temp 97.9 F (36.6 C) (Oral)   Resp 18   Ht '5\' 7"'$  (1.702 m)   Wt 121.2 kg   LMP 03/03/2021 (Exact Date)   SpO2 98%   BMI 41.83 kg/m  GENERAL: Well-developed, well-nourished female in no acute distress.  HEAD: Normocephalic, atraumatic.  CHEST: Normal effort of breathing, regular heart rate ABDOMEN: Soft, nontender, gravid PELVIC: Amnisure collected suing blind swab technique by RN.   Cervical exam:     Fetal Monitoring: Baseline: 130 Variability: moderate Accelerations: present Decelerations: absent Contractions: none  Results for orders placed or performed during the hospital encounter of 12/28/21 (from the past 24 hour(s))  Amnisure rupture of membrane (rom)not [facility]    Status: None   Collection Time: 12/28/21  2:21 PM  Result Value Ref Range   Amnisure ROM NEGATIVE   Wet prep, genital     Status: None   Collection Time: 12/28/21  2:28 PM   Specimen: Vaginal  Result Value Ref Range   Yeast Wet Prep HPF POC NONE SEEN NONE SEEN   Trich, Wet Prep NONE SEEN NONE SEEN   Clue Cells Wet Prep HPF POC NONE SEEN NONE SEEN   WBC, Wet Prep HPF POC <10 <10   Sperm NONE SEEN     A/P: SIUP at 369w3dMembranes intact  1. No leakage of amniotic fluid into vagina - Reassurance given that water is not broken and this is a normal in this stage of pregnancy  2. Vaginal discharge during pregnancy in third trimester - Advised of no vaginal infection  3. [redacted] weeks gestation of pregnancy   - Discharge patient - Keep scheduled appt with [facility] on 01/02/2022 - Patient verbalized an understanding of the plan of care and agrees.    RoLaury DeepCNM 12/28/2021, 1:55 PM

## 2021-12-28 NOTE — Discharge Instructions (Signed)
Your water did not break and you do not have a vaginal infection to cause increased vaginal discharge. This is normal in this stage of your pregnancy.

## 2021-12-30 LAB — CULTURE, BETA STREP (GROUP B ONLY): Strep Gp B Culture: NEGATIVE

## 2021-12-31 ENCOUNTER — Ambulatory Visit (INDEPENDENT_AMBULATORY_CARE_PROVIDER_SITE_OTHER): Payer: Medicaid Other | Admitting: Clinical

## 2021-12-31 DIAGNOSIS — F4323 Adjustment disorder with mixed anxiety and depressed mood: Secondary | ICD-10-CM

## 2021-12-31 NOTE — Patient Instructions (Signed)
Center for [facility] [place], [place] [address] [phone] (main office) [phone] [facility] office)  [email or web address]  /Emotional Wellbeing Apps and Websites Here are a few free apps meant to help you to help yourself.  To find, try searching on the internet to see if the app is offered on Apple/Android devices. If your first choice doesn't come up on your device, the good news is that there are many choices! Play around with different apps to see which ones are helpful to you.    Calm This is an app meant to help increase calm feelings. Includes info, strategies, and tools for tracking your feelings.      Calm Harm  This app is meant to help with self-harm. Provides many 5-minute or 15-min coping strategies for doing instead of hurting yourself.       [place] is a problem-solving tool to help deal with emotions and cope with stress you encounter wherever you are.      MindShift This app can help people cope with anxiety. Rather than trying to avoid anxiety, you can make an important shift and face it.      MY3  MY3 features a support system, safety plan and resources with the goal of offering a tool to use in a time of need.       My Life My Voice  This mood journal offers a simple solution for tracking your thoughts, feelings and moods. Animated emoticons can help identify your mood.       Relax Melodies Designed to help with sleep, on this app you can mix sounds and meditations for relaxation.      Smiling Mind Smiling Mind is meditation made easy: it's a simple tool that helps put a smile on your mind.        Stop, Breathe & Think  A friendly, simple guide for people through meditations for mindfulness and compassion.  Stop, Breathe and Think Kids Enter your current feelings and choose a "mission" to help you cope. Offers videos for certain moods instead of just sound  recordings.       Team Orange The goal of this tool is to help teens change how they think, act, and react. This app helps you focus on your own good feelings and experiences.      The [place] Box The [place] Box (VHB) contains simple tools to help patients with coping, relaxation, distraction, and positive thinking.       BRAINSTORMING  Develop a Plan Goals: Provide a way to start conversation about your new life with a baby Assist parents in recognizing and using resources within their reach Help pave the way before birth for an easier period of transition afterwards.  Make a list of the following information to keep in a central location: Full name of Mom and Partner: _____________________________________________ 22 full name and Date of Birth: ___________________________________________ Home Address: ___________________________________________________________ ________________________________________________________________________ Home Phone: ____________________________________________________________ Parents' cell numbers: _____________________________________________________ ________________________________________________________________________ Name and contact info for OB: ______________________________________________ Name and contact info for Pediatrician:________________________________________ Contact info for Lactation Consultants: ________________________________________  REST and SLEEP *You each need at least 4-5 hours of uninterrupted sleep every day. Write specific names and contact information.* How are you going to rest in the postpartum period? While partner's home? When partner returns to work? When you both return to work? Where will your baby sleep? Who is available to help during the day? Evening? Night? Who could  move in for a period to help support you? What are some ideas to help you get enough  sleep? __________________________________________________________________________________________________________________________________________________________________________________________________________________________________________ NUTRITIOUS FOOD AND DRINK *Plan for meals before your baby is born so you can have healthy food to eat during the immediate postpartum period.* Who will look after breakfast? Lunch? Dinner? List names and contact information. Brainstorm quick, healthy ideas for each meal. What can you do before baby is born to prepare meals for the postpartum period? How can others help you with meals? Which grocery stores provide online shopping and delivery? Which restaurants offer take-out or delivery options? ______________________________________________________________________________________________________________________________________________________________________________________________________________________________________________________________________________________________________________________________________________________________________________________________________  CARE FOR MOM *It's important that mom is cared for and pampered in the postpartum period. Remember, the most important ways new mothers need care are: sleep, nutrition, gentle exercise, and time off.* Who can come take care of mom during this period? Make a list of people with their contact information. List some activities that make you feel cared for, rested, and energized? Who can make sure you have opportunities to do these things? Does mom have a space of her very own within your home that's just for her? Make a "[facility]" where she can be comfortable, rest, and renew herself  daily. ______________________________________________________________________________________________________________________________________________________________________________________________________________________________________________________________________________________________________________________________________________________________________________________________________    CARE FOR AND FEEDING BABY *Knowledgeable and encouraging people will offer the best support with regard to feeding your baby.* Educate yourself and choose the best feeding option for your baby. Make a list of people who will guide, support, and be a resource for you as your care for and feed your baby. (Friends that have breastfed or are currently breastfeeding, lactation consultants, breastfeeding support groups, etc.) Consider a postpartum doula. (These websites can give you information: dona.org & BuyingShow.es) Seek out local breastfeeding resources like the breastfeeding support group at [employer] or [employer]. ______________________________________________________________________________________________________________________________________________________________________________________________________________________________________________________________________________________________________________________________________________________________________________________________________  Verner Chol AND ERRANDS Who can help with a thorough cleaning before baby is born? Make a list of people who will help with housekeeping and chores, like laundry, light cleaning, dishes, bathrooms, etc. Who can run some errands for you? What can you do to make sure you are stocked with basic supplies before baby is born? Who is going to do the  shopping? ______________________________________________________________________________________________________________________________________________________________________________________________________________________________________________________________________________________________________________________________________________________________________________________________________     Family Adjustment *Nurture yourselves.it helps parents be more loving and allows for better bonding with their child.* What sorts of things do you and partner enjoy doing together? Which activities help you to connect and strengthen your relationship? Make a list of those things. Make a list of people whom you trust to care for your baby so you can have some time together as a couple. What types of things help partner feel connected to Mom? Make a list. What needs will partner have in order to bond with baby? Other children? Who will care for them when you go into labor and while you are in the hospital? Think about what the needs of your older children might be. Who can help you meet those needs? In what ways are you helping them prepare for bringing baby home? List some specific strategies you have for family adjustment. _______________________________________________________________________________________________________________________________________________________________________________________________________________________________________________________________________________________________________________________________________________  SUPPORT *Someone who can empathize with experiences normalizes your problems and makes them more bearable.* Make a list of other friends, neighbors, and/or co-workers you know with infants (and small children, if applicable) with whom you can connect. Make a list of local or online support groups, mom groups, etc. in which you can be  involved. ______________________________________________________________________________________________________________________________________________________________________________________________________________________________________________________________________________________________________________________________________________________________________________________________________  Childcare Plans Investigate and plan for childcare  if mom is returning to work. Talk about mom's concerns about her transition back to work. Talk about partner's concerns regarding this transition.  Mental Health *Your mental health is one of the highest priorities for a pregnant or postpartum mom.* 1 in 5 women experience anxiety and/or depression from the time of conception through the first year after birth. Postpartum Mood Disorders are the #1 complication of pregnancy and childbirth and the suffering experienced by these mothers is not necessary! These illnesses are temporary and respond well to treatment, which often includes self-care, social support, talk therapy, and medication when needed. Women experiencing anxiety and depression often say things like: "I'm supposed to be happy.why do I feel so sad?", "Why can't I snap out of it?", "I'm having thoughts that scare me." There is no need to be embarrassed if you are feeling these symptoms: Overwhelmed, anxious, angry, sad, guilty, irritable, hopeless, exhausted but can't sleep You are NOT alone. You are NOT to blame. With help, you WILL be well. Where can I find help? Medical professionals such as your OB, midwife, gynecologist, family practitioner, primary care provider, pediatrician, or mental health providers; [facility] support groups: Feelings After Birth, Breastfeeding Support Group, Baby and Me Group, and Fit 4 Two exercise classes. You have permission to ask for help. It will confirm your feelings, validate your experiences,  share/learn coping strategies, and gain support and encouragement as you heal. You are important! BRAINSTORM Make a list of local resources, including resources for mom and for partner. Identify support groups. Identify people to call late at night - include names and contact info. Talk with partner about perinatal mood and anxiety disorders. Talk with your OB, midwife, and doula about baby blues and about perinatal mood and anxiety disorders. Talk with your pediatrician about perinatal mood and anxiety disorders.   Support & Sanity Savers   What do you really need?  Basics In preparing for a new baby, many expectant parents spend hours shopping for baby clothes, decorating the nursery, and deciding which car seat to buy. Yet most don't think much about what the reality of parenting a newborn will be like, and what they need to make it through that. So, here is the advice of experienced parents. We know you'll read this, and think "they're exaggerating, I don't really need that." Just trust [place] on these, OK? Plan for all of this, and if it turns out you don't need it, come back and teach [place] how you did it!  Must-Haves (Once baby's survival needs are met, make sure you attend to your own survival needs!) Sleep An average newborn sleeps 16-18 hours per day, over 6-7 sleep periods, rarely more than three hours at a time. It is normal and healthy for a newborn to wake throughout the night... but really hard on parents!! Naps. Prioritize sleep above any responsibilities like: cleaning house, visiting friends, running errands, etc.  Sleep whenever baby sleeps. If you can't nap, at least have restful times when baby eats. The more rest you get, the more patient you will be, the more emotionally stable, and better at solving problems.  Food You may not have realized it would be difficult to eat when you have a newborn. Yet, when we talk to countless new parents, they say things like "it may be 2:00 pm  when I realize I haven't had breakfast yet." Or "every time we sit down to dinner, baby needs to eat, and my food gets cold, so I don't bother  to eat it." Finger food. Before your baby is born, stock up with one months' worth of food that: 1) you can eat with one hand while holding a baby, 2) doesn't need to be prepped, 3) is good hot or cold, 4) doesn't spoil when left out for a few hours, and 5) you like to eat. Think about: nuts, dried fruit, Clif bars, pretzels, jerky, gogurt, baby carrots, apples, bananas, crackers, cheez-n-crackers, string cheese, hot pockets or frozen burritos to microwave, garden burgers and breakfast pastries to put in the toaster, yogurt drinks, etc. Restaurant Menus. Make lists of your favorite restaurants & menu items. When family/friends want to help, you can give specific information without much thought. They can either bring you the food or send gift cards for just the right meals. Freezer Meals.  Take some time to make a few meals to put in the freezer ahead of time.  Easy to freeze meals can be anything such as soup, lasagna, chicken pie, or spaghetti sauce. Set up a Meal Schedule.  Ask friends and family to sign up to bring you meals during the first few weeks of being home. (It can be passed around at baby showers!) You have no idea how helpful this will be until you are in the throes of parenting.  https://hamilton-[email or web address] is a great website to check out. Emotional Support Know who to call when you're stressed out. Parenting a newborn is very challenging work. There are times when it totally overwhelms your normal coping abilities. EVERY NEW PARENT NEEDS TO HAVE A PLAN FOR WHO TO CALL WHEN THEY JUST CAN'T COPE ANY MORE. (And it has to be someone other than the baby's other parent!) Before your baby is born, come up with at least one person you can call for support - write their phone number down and post it on the refrigerator. Anxiety & Sadness. Baby blues are normal after  pregnancy; however, there are more severe types of anxiety & sadness which can occur and should not be ignored.  They are always treatable, but you have to take the first step by reaching out for help. [facility] offers a "Mom Talk" group which meets every Tuesday from 10 am - 11 am.  This group is for new moms who need support and connection after their babies are born.  Call [phone].  Really, Really Helpful (Plan for them! Make sure these happen often!!) Physical Support with Taking Care of Yourselves Asking friends and family. Before your baby is born, set up a schedule of people who can come and visit and help out (or ask a friend to schedule for you). Any time someone says "let me know what I can do to help," sign them up for a day. When they get there, their job is not to take care of the baby (that's your job and your joy). Their job is to take care of you!  Postpartum doulas. If you don't have anyone you can call on for support, look into postpartum doulas:  professionals at helping parents with caring for baby, caring for themselves, getting breastfeeding started, and helping with household tasks. www.padanc.org is a helpful website for learning about doulas in our area. Peer Support / Parent Groups Why: One of the greatest ideas for new parents is to be around other new parents. Parent groups give you a chance to share and listen to others who are going through the same season of life, get a sense of what is normal infant development  by watching several babies learn and grow, share your stories of triumph and struggles with empathetic ears, and forgive your own mistakes when you realize all parents are learning by trial and error. Where to find: There are many places you can meet other new parents throughout our community.  [facility] offers the following classes for new moms and their little ones:  Baby and Me (Birth to Welcome) and Breastfeeding Support Group. Go to  www.conehealthybaby.com or call [phone] for more information. Time for your Relationship It's easy to get so caught up in meeting baby's immediate needs that it's hard to find time to connect with your partner, and meet the needs of your relationship. It's also easy to forget what "quality time with your partner" actually looks like. If you take your baby on a date, you'd be amazed how much of your couple time is spent feeding the baby, diapering the baby, admiring the baby, and talking about the baby. Dating: Try to take time for just the two of you. Babysitter tip: Sometimes when moms are breastfeeding a newborn, they find it hard to figure out how to schedule outings around baby's unpredictable feeding schedules. Have the babysitter come for a three hour period. When she comes over, if baby has just eaten, you can leave right away, and come back in two hours. If baby hasn't fed recently, you start the date at home. Once baby gets hungry and gets a good feeding in, you can head out for the rest of your date time. Date Nights at Home: If you can't get out, at least set aside one evening a week to prioritize your relationship: whenever baby dozes off or doesn't have any immediate needs, spend a little time focusing on each other. Potential conflicts: The main relationship conflicts that come up for new parents are: issues related to sexuality, financial stresses, a feeling of an unfair division of household tasks, and conflicts in parenting styles. The more you can work on these issues before baby arrives, the better!  Fun and Frills (Don't forget these. and don't feel guilty for indulging in them!) Everyone has something in life that is a fun little treat that they do just for themselves. It may be: reading the morning paper, or going for a daily jog, or having coffee with a friend once a week, or going to a movie on Friday nights, or fine chocolates, or bubble baths, or curling up with a good  book. Unless you do fun things for yourself every now and then, it's hard to have the energy for fun with your baby. Whatever your "special" treats are, make sure you find a way to continue to indulge in them after your baby is born. These special moments can recharge you, and allow you to return to baby with a new joy   PERINATAL MOOD DISORDERS: [place]   _________________________________________Emergency and Crisis Resources If you are an imminent risk to self or others, are experiencing intense personal distress, and/or have noticed significant changes in activities of daily living, call:  [place]: [phone]  [address], [place], [place], [address] Mobile Crisis: [place]: [phone] Or visit the following crisis centers: Local Emergency Departments [facility]: [address] [place]. Hours: 8:30AM-5PM. Insurance Accepted: Medicaid, Medicare, and Uninsured.  [facility]:  [address] [place]  Mon-Friday 8am-3pm, [phone]  ___________ Non-Crisis Resources To identify specific providers that are covered by your insurance, Leisure centre manager company or local agencies:  [place]: [phone] [facility] and [place]: [phone] [place]: [phone] Postpartum Support International- Warm-line: (450)417-1034                                                      __Outpatient Therapy and Medication Management   Providers:  [facility]: [phone] Hours: 9AM-5PM  Insurance Accepted: Alben Spittle, Shane Crutch, [place], [place] [facility] Care): [phone] Hours: 8AM-5:30PM  nsurance Accepted: All insurances EXCEPT AARP, [place],  [place], and [place]: [phone] Hours: 8AM-8PM Insurance Accepted: Cristal Ford, Freddrick March, [place], Medicare, [phone] [facility]: [phone] Hours: 8:30AM-7PM Insurance Accepted: Cristal Ford, Medicaid, Medicare, Tricare, [employer] Counseling:  [place] Accepted:  Holland Falling, Lorella Nimrod, [employer], [place]: [phone] Hours: 9AM-5:30PM Insurance Accepted: Alben Spittle, Charlotte Crumb, and Medicaid, Medicare, [facility]:  [phone] Hours: 9am-5pm Insurance Accepted: BCBS; they do not accept Medicaid/Medicare [place]: [phone] Hours: 9am-9pm Insurance Accepted: All major insurance including Medicaid and Medicare [facility]: [phone] Hours: [place] Accepted: All insurances EXCEPT Medicaid and Medicare. [facility]: [phone]   ____________                                                                     [place]: [phone] [place]:  [place]: (support for children in the NICU and/or with special needs), [phone]   ___________                                                                 Mental Health Support Groups Mental Health Association: [phone]    _____________                                                                                  Online Resources Postpartum Support International: http://jones-berg.com/  800-944-4PPD 2Moms Supporting Moms:  www.momssupportingmoms.net

## 2022-01-02 ENCOUNTER — Encounter: Payer: Medicaid Other | Admitting: Obstetrics and Gynecology

## 2022-01-02 ENCOUNTER — Ambulatory Visit (INDEPENDENT_AMBULATORY_CARE_PROVIDER_SITE_OTHER): Payer: Medicaid Other | Admitting: Obstetrics and Gynecology

## 2022-01-02 ENCOUNTER — Other Ambulatory Visit: Payer: Self-pay

## 2022-01-02 VITALS — BP 114/77 | HR 101 | Wt 268.0 lb

## 2022-01-02 DIAGNOSIS — O09299 Supervision of pregnancy with other poor reproductive or obstetric history, unspecified trimester: Secondary | ICD-10-CM

## 2022-01-02 DIAGNOSIS — D369 Benign neoplasm, unspecified site: Secondary | ICD-10-CM

## 2022-01-02 DIAGNOSIS — Z3A39 39 weeks gestation of pregnancy: Secondary | ICD-10-CM

## 2022-01-02 DIAGNOSIS — Z6841 Body Mass Index (BMI) 40.0 and over, adult: Secondary | ICD-10-CM

## 2022-01-02 DIAGNOSIS — O99210 Obesity complicating pregnancy, unspecified trimester: Secondary | ICD-10-CM

## 2022-01-02 NOTE — Progress Notes (Signed)
Patient informs me that she had frequent contractions that lasted 1-2 minutes and were less than 10 minutes apart

## 2022-01-02 NOTE — Progress Notes (Signed)
Pt requested membrane sweep but requested female provider. SVE unchanged from previous exam, 1cm internally, 2cm external os - membrane sweep performed as well as possible at 1cm. CMA present as chaperone. Instructed pt to walk this afternoon to help facilitate start of labor.  Gaylan Gerold, CNM, MSN, [place] Certified Nurse Midwife, [place] [facility]

## 2022-01-02 NOTE — Progress Notes (Signed)
   PRENATAL VISIT NOTE  Subjective:  Nichole [name] is a 21 y.o. G2P1001 at 50w1dbeing seen today for ongoing prenatal care.  She is currently monitored for the following issues for this low-risk pregnancy and has Supervision of other normal pregnancy, antepartum; History of benign brain tumor; Eczema; Asthma, mild intermittent; Dermoid cyst; Alpha thalassemia silent carrier; ADHD (attention deficit hyperactivity disorder); Endometriosis; Hx of preeclampsia, prior pregnancy, currently pregnant; Short interval between pregnancies affecting pregnancy in third trimester, antepartum; BMI 40s; and Obesity in pregnancy on their problem list.  Patient reports occasional contractions.  Contractions: Irritability. Vag. Bleeding: None.  Movement: Present. Denies leaking of fluid.   The following portions of the patient's history were reviewed and updated as appropriate: allergies, current medications, past family history, past medical history, past social history, past surgical history and problem list.   Objective:   Vitals:   01/02/22 1621  BP: 114/77  Pulse: (!) 101  Weight: 268 lb (121.6 kg)    Fetal Status: Fetal Heart Rate (bpm): 129 Fundal Height: 39 cm Movement: Present  Presentation: Vertex  General:  Alert, oriented and cooperative. Patient is in no acute distress.  Skin: Skin is warm and dry. No rash noted.   Cardiovascular: Normal heart rate noted  Respiratory: Normal respiratory effort, no problems with respiration noted  Abdomen: Soft, gravid, appropriate for gestational age.  Pain/Pressure: Present     Pelvic: Cervical exam performed in the presence of a chaperone Dilation: 1 Effacement (%): 50    Extremities: Normal range of motion.     Mental Status: Normal mood and affect. Normal behavior. Normal judgment and thought content.   Assessment and Plan:  Pregnancy: G2P1001 at 363w1d. [redacted] weeks gestation of pregnancy  2. Dermoid cyst 8-12wk PP u/s recommended. 1-2cm in size,  bilaterally, on June 8th  3. BMI 40s IOL around EDC>>pt set up for 8/14 in the AM  4. Obesity in pregnancy  5. Hx of preeclampsia, prior pregnancy, currently pregnant  Term labor symptoms and general obstetric precautions including but not limited to vaginal bleeding, contractions, leaking of fluid and fetal movement were reviewed in detail with the patient. Please refer to After Visit Summary for other counseling recommendations.   Return in about 5 days (around 01/07/2022) for in person, nst/bpp with diane or carrie.  Future Appointments  Date Time Provider [place]8/14/2023  7:00 AM [facility]-LD [facility] [place]  01/09/2022  1:15 PM [facility]8/16/2023  2:15 PM PiAletha HalimMD [facility]8/30/2023  1:15 PM [facility]IOR[facility] HE[facility] CLINIC[facility]  ChAletha HalimMD

## 2022-01-03 ENCOUNTER — Encounter: Payer: Self-pay | Admitting: Family Medicine

## 2022-01-03 ENCOUNTER — Encounter: Payer: Self-pay | Admitting: Obstetrics and Gynecology

## 2022-01-04 ENCOUNTER — Encounter: Payer: Self-pay | Admitting: *Deleted

## 2022-01-04 ENCOUNTER — Encounter (HOSPITAL_COMMUNITY): Payer: Self-pay | Admitting: *Deleted

## 2022-01-04 ENCOUNTER — Telehealth (HOSPITAL_COMMUNITY): Payer: Self-pay | Admitting: *Deleted

## 2022-01-04 NOTE — Telephone Encounter (Signed)
Preadmission screen  

## 2022-01-07 ENCOUNTER — Inpatient Hospital Stay (HOSPITAL_COMMUNITY): Payer: Medicaid Other | Admitting: Anesthesiology

## 2022-01-07 ENCOUNTER — Inpatient Hospital Stay (HOSPITAL_COMMUNITY)
Admission: AD | Admit: 2022-01-07 | Discharge: 2022-01-09 | DRG: 807 | Disposition: A | Discharge status: Home / Self Care | Payer: Medicaid Other | Attending: Family Medicine | Admitting: Family Medicine

## 2022-01-07 ENCOUNTER — Inpatient Hospital Stay (HOSPITAL_COMMUNITY): Payer: Medicaid Other

## 2022-01-07 ENCOUNTER — Encounter (HOSPITAL_COMMUNITY): Payer: Self-pay | Admitting: Obstetrics and Gynecology

## 2022-01-07 DIAGNOSIS — D563 Thalassemia minor: Secondary | ICD-10-CM | POA: Diagnosis present

## 2022-01-07 DIAGNOSIS — Z3A39 39 weeks gestation of pregnancy: Secondary | ICD-10-CM | POA: Diagnosis not present

## 2022-01-07 DIAGNOSIS — J4520 Mild intermittent asthma, uncomplicated: Secondary | ICD-10-CM | POA: Diagnosis present

## 2022-01-07 DIAGNOSIS — O99210 Obesity complicating pregnancy, unspecified trimester: Secondary | ICD-10-CM | POA: Diagnosis present

## 2022-01-07 DIAGNOSIS — O9952 Diseases of the respiratory system complicating childbirth: Secondary | ICD-10-CM | POA: Diagnosis present

## 2022-01-07 DIAGNOSIS — Z3480 Encounter for supervision of other normal pregnancy, unspecified trimester: Principal | ICD-10-CM

## 2022-01-07 DIAGNOSIS — O99214 Obesity complicating childbirth: Secondary | ICD-10-CM | POA: Diagnosis present

## 2022-01-07 DIAGNOSIS — D369 Benign neoplasm, unspecified site: Secondary | ICD-10-CM

## 2022-01-07 LAB — CBC
HCT: 30.8 % — ABNORMAL LOW (ref 36.0–46.0)
Hemoglobin: 9.7 g/dL — ABNORMAL LOW (ref 12.0–15.0)
MCH: 23.5 pg — ABNORMAL LOW (ref 26.0–34.0)
MCHC: 31.5 g/dL (ref 30.0–36.0)
MCV: 74.6 fL — ABNORMAL LOW (ref 80.0–100.0)
Platelets: 323 10*3/uL (ref 150–400)
RBC: 4.13 MIL/uL (ref 3.87–5.11)
RDW: 15.9 % — ABNORMAL HIGH (ref 11.5–15.5)
WBC: 7.4 10*3/uL (ref 4.0–10.5)
nRBC: 0 % (ref 0.0–0.2)

## 2022-01-07 LAB — TYPE AND SCREEN
ABO/RH(D): O POS
Antibody Screen: NEGATIVE

## 2022-01-07 LAB — RPR: RPR Ser Ql: NONREACTIVE

## 2022-01-07 MED ORDER — MISOPROSTOL 25 MCG QUARTER TABLET
25.0000 ug | ORAL_TABLET | ORAL | Status: DC | PRN
Start: 2022-01-07 — End: 2022-01-07
  Administered 2022-01-07: 25 ug via VAGINAL
  Filled 2022-01-07 (×2): qty 1

## 2022-01-07 MED ORDER — FENTANYL-BUPIVACAINE-NACL 0.5-0.125-0.9 MG/250ML-% EP SOLN
12.0000 mL/h | EPIDURAL | Status: DC | PRN
  Administered 2022-01-07: 12 mL/h via EPIDURAL
  Filled 2022-01-07: qty 250

## 2022-01-07 MED ORDER — LIDOCAINE HCL (PF) 1 % IJ SOLN: 30.0000 mL | INTRAMUSCULAR | Status: DC | PRN

## 2022-01-07 MED ORDER — EPHEDRINE 5 MG/ML INJ: 10.0000 mg | INTRAVENOUS | Status: DC | PRN

## 2022-01-07 MED ORDER — ONDANSETRON HCL 4 MG/2ML IJ SOLN: 4.0000 mg | Freq: Four times a day (QID) | INTRAMUSCULAR | Status: DC | PRN

## 2022-01-07 MED ORDER — OXYTOCIN-SODIUM CHLORIDE 30-0.9 UT/500ML-% IV SOLN: 2.5000 [IU]/h | INTRAVENOUS | Status: DC

## 2022-01-07 MED ORDER — FENTANYL CITRATE (PF) 100 MCG/2ML IJ SOLN
100.0000 ug | Freq: Once | INTRAMUSCULAR | Status: AC
  Administered 2022-01-07: 100 ug via INTRAVENOUS

## 2022-01-07 MED ORDER — LACTATED RINGERS IV SOLN: 500.0000 mL | Freq: Once | INTRAVENOUS | Status: DC

## 2022-01-07 MED ORDER — OXYTOCIN BOLUS FROM INFUSION
333.0000 mL | Freq: Once | INTRAVENOUS | Status: AC
  Administered 2022-01-08: 333 mL via INTRAVENOUS

## 2022-01-07 MED ORDER — TERBUTALINE SULFATE 1 MG/ML IJ SOLN
0.2500 mg | Freq: Once | INTRAMUSCULAR | Status: DC | PRN
Start: 2022-01-07 — End: 2022-01-08

## 2022-01-07 MED ORDER — SOD CITRATE-CITRIC ACID 500-334 MG/5ML PO SOLN: 30.0000 mL | ORAL | Status: DC | PRN

## 2022-01-07 MED ORDER — LACTATED RINGERS IV SOLN: 500.0000 mL | INTRAVENOUS | Status: DC | PRN

## 2022-01-07 MED ORDER — PHENYLEPHRINE 80 MCG/ML (10ML) SYRINGE FOR IV PUSH (FOR BLOOD PRESSURE SUPPORT): 80.0000 ug | PREFILLED_SYRINGE | INTRAVENOUS | Status: DC | PRN

## 2022-01-07 MED ORDER — TERBUTALINE SULFATE 1 MG/ML IJ SOLN: 0.2500 mg | Freq: Once | INTRAMUSCULAR | Status: DC | PRN

## 2022-01-07 MED ORDER — FENTANYL CITRATE (PF) 100 MCG/2ML IJ SOLN
INTRAMUSCULAR | Status: AC
  Filled 2022-01-07: qty 2

## 2022-01-07 MED ORDER — FENTANYL CITRATE (PF) 100 MCG/2ML IJ SOLN
50.0000 ug | INTRAMUSCULAR | Status: AC | PRN
  Administered 2022-01-07 (×2): 50 ug via INTRAVENOUS
  Filled 2022-01-07 (×2): qty 2

## 2022-01-07 MED ORDER — PHENYLEPHRINE 80 MCG/ML (10ML) SYRINGE FOR IV PUSH (FOR BLOOD PRESSURE SUPPORT)
80.0000 ug | PREFILLED_SYRINGE | INTRAVENOUS | Status: DC | PRN
  Filled 2022-01-07: qty 10

## 2022-01-07 MED ORDER — DIPHENHYDRAMINE HCL 50 MG/ML IJ SOLN: 12.5000 mg | INTRAMUSCULAR | Status: DC | PRN

## 2022-01-07 MED ORDER — ACETAMINOPHEN 325 MG PO TABS: 650.0000 mg | ORAL_TABLET | ORAL | Status: DC | PRN

## 2022-01-07 MED ORDER — OXYTOCIN-SODIUM CHLORIDE 30-0.9 UT/500ML-% IV SOLN
1.0000 m[IU]/min | INTRAVENOUS | Status: DC
  Administered 2022-01-07: 2 m[IU]/min via INTRAVENOUS
  Filled 2022-01-07: qty 500

## 2022-01-07 MED ORDER — MISOPROSTOL 50MCG HALF TABLET
50.0000 ug | ORAL_TABLET | ORAL | Status: DC | PRN
Start: 2022-01-07 — End: 2022-01-08
  Administered 2022-01-07: 50 ug via ORAL
  Filled 2022-01-07: qty 1

## 2022-01-07 MED ORDER — LACTATED RINGERS IV SOLN: INTRAVENOUS | Status: DC

## 2022-01-07 NOTE — Progress Notes (Signed)
Labor Progress Note Nichole [name] is a 21 y.o. G2P1001 at 74w6dpresented for IOL 2/2 BMI >40.   S: Doing well, no concerns at this time. On birthing ball.   O:  BP 121/65   Pulse 92   Temp 98.3 F (36.8 C) (Oral)   Resp 20   Ht '5\' 7"'$  (1.702 m)   LMP 03/03/2021 (Exact Date)   BMI 41.97 kg/m  EFM: 135 bpm/moderate/+accels, no decels (difficult tracing 2/2 mother bouncing on birthing ball)  CVE: Dilation: 4 Effacement (%): 50 Station: -3 Presentation: Vertex Exam by:: Dr AJanus Molder  A&P: 21y.o. G2P1001 380w6dere for IOL 2/2 BMI >40.  #Labor: Progressing well. Will start pitocin. Discussed possibility of waterbirth. She is on the fence and plans to see if she will get an epidural.  #Pain: IV anaglesics, considering epidural, also NO available.  #FWB: Cat I #GBS negative   Nichole Bowdish Autry-Lott, DO 8:56 PM

## 2022-01-07 NOTE — Progress Notes (Signed)
Labor Progress Note Nichole [name] is a 21 y.o. G2P1001 at 29w6dpresented for IOL for BMI >40  S:  Feeling some regular ctx. Using birth ball for movement.   O:  BP 137/82   Pulse 95   Temp 97.9 F (36.6 C) (Oral)   Resp 20   Ht '5\' 7"'$  (1.702 m)   LMP 03/03/2021 (Exact Date)   BMI 41.97 kg/m  EFM: baseline 135 bpm/ mod variability/ + accels/ no decels  Toco/IUPC: irreg SVE: Dilation: 1.5 Effacement (%): Thick Station: -3 Presentation: Vertex Exam by:: M.Ilhan Madan, CNM  A/P: 21y.o. G2P1001 335w6d1. Labor: latent 2. FWB: Cat I 3. Pain: analgesia/anesthesia/NO prn  Consented for FB, placed w/o difficulty. Continue Cytotec, switch to oral.  Anticipate labor progress and SVD.  MeJulianne HandlerCNM 3:50 PM

## 2022-01-07 NOTE — H&P (Signed)
OBSTETRIC ADMISSION HISTORY AND PHYSICAL  Nichole [name] is a 21 y.o. female G2P1001 with IUP at 71w6dby U/S presenting for IOL for BMI >40. She reports +FMs, No LOF, no VB, no blurry vision, headaches or peripheral edema, and RUQ pain.  She plans on breast feeding. She requests the patch for birth control. [facility]  Dating: By U/S --->  Estimated Date of Delivery: 01/08/22  Sono:    '@[redacted]w[redacted]d'$ , CWD, normal anatomy, vertex presentation,  1468g, 24% EFW  Prenatal History/Complications:  - declined GTT, A1C 5.7 - Hx Pre-E - BMI 42 - alpha thal silent carrier - short interval between pregnancy   Past Medical History: Past Medical History:  Diagnosis Date   Anemia    Asthma    Elevated hemoglobin A1c 11/24/2021   On 6/28- recommended 2hr GTT: scheduled 7/3   Tumor    benign on skull    Past Surgical History: Past Surgical History:  Procedure Laterality Date   ADENOIDECTOMY     CRANIOTOMY     OTHER SURGICAL HISTORY Bilateral    had "nasal passages shrunk"   TONSILLECTOMY      Obstetrical History: OB History     Gravida  2   Para  1   Term  1   Preterm      AB      Living  1      SAB      IAB      Ectopic      Multiple      Live Births  1           Social History Social History   Socioeconomic History   Marital status: Single    Spouse name: Not on file   Number of children: Not on file   Years of education: Not on file   Highest education level: Not on file  Occupational History   Not on file  Tobacco Use   Smoking status: Never   Smokeless tobacco: Never  Vaping Use   Vaping Use: Former  Substance and Sexual Activity   Alcohol use: Not Currently    Comment: not while preg   Drug use: Never   Sexual activity: Yes    Birth control/protection: None  Other Topics Concern   Not on file  Social History Narrative   Not on file   Social Determinants of Health   Financial Resource Strain: Not on file  Food  Insecurity: Food Insecurity Present (12/05/2021)   Hunger Vital Sign    Worried About Running Out of Food in the Last Year: Sometimes true    Ran Out of Food in the Last Year: Never true  Transportation Needs: No Transportation Needs (12/05/2021)   PRAPARE - THydrologist(Medical): No    Lack of Transportation (Non-Medical): No  Physical Activity: Not on file  Stress: Not on file  Social Connections: Not on file    Family History: Family History  Problem Relation Age of Onset   Hypertension Father    Liver disease Sister        NASH   Heart disease Maternal Grandmother    Hypertension Maternal Grandmother    Diabetes Maternal Grandmother    HIV Maternal Grandmother     Allergies: Allergies  Allergen Reactions   Peanut (Diagnostic) Hives    Pt denies allergies to latex, iodine, or shellfish.  Medications Prior to Admission  Medication Sig Dispense Refill  Last Dose   hydrOXYzine (VISTARIL) 25 MG capsule Take 1 capsule (25 mg total) by mouth 3 (three) times daily as needed. 30 capsule 0    loratadine (CLARITIN) 10 MG tablet Take 1 tablet (10 mg total) by mouth daily. 30 tablet 2    Prenatal Vit-Fe Fumarate-FA (MULTIVITAMIN-PRENATAL) 27-0.8 MG TABS tablet Take 1 tablet by mouth daily at 12 noon.      terconazole (TERAZOL 7) 0.4 % vaginal cream Place 1 applicator vaginally at bedtime. Use for seven days 45 g 0      Review of Systems   All systems reviewed and negative except as stated in HPI  Blood pressure 125/77, pulse (!) 104, temperature 97.9 F (36.6 C), temperature source Oral, resp. rate 16, height '5\' 7"'$  (1.702 m), last menstrual period 03/03/2021. General appearance: alert and cooperative Lungs: clear to auscultation bilaterally Heart: regular rate and rhythm Abdomen: soft, non-tender; bowel sounds normal Extremities: Homans sign is negative, no sign of DVT Presentation: cephalic Fetal monitoringBaseline: 145 bpm, Variability: Good {>  6 bpm), Accelerations: Reactive, and Decelerations: Absent Uterine activityNone Dilation: 1.5 Effacement (%): Thick Station: -3 Exam by:: M.Raydin Bielinski, CNM Prenatal labs: ABO, Rh: --/--/O POS (08/14 1030) Antibody: NEG (08/14 1030) Rubella: 19.70 (03/03 1120) RPR: Non Reactive (07/03 0933)  HBsAg: Negative (03/03 1120)  HIV: Non Reactive (07/03 0933)  GBS: Negative/-- (08/02 1538)  2 hr Glucola: normal Genetic screening:  low risk female  Anatomy US: normal   Prenatal Transfer Tool  Maternal Diabetes: No Genetic Screening: Normal Maternal Ultrasounds/Referrals: Normal Fetal Ultrasounds or other Referrals:  None Maternal Substance Abuse:  No Significant Maternal Medications:  None Significant Maternal Lab Results: Group B Strep negative  Results for orders placed or performed during the hospital encounter of 01/07/22 (from the past 24 hour(s))  Type and screen   Collection Time: 01/07/22 10:30 AM  Result Value Ref Range   ABO/RH(D) O POS    Antibody Screen NEG    Sample Expiration      01/10/2022,2359 Performed at [facility], [place] [address] [place], [place] [address]   CBC   Collection Time: 01/07/22 10:37 AM  Result Value Ref Range   WBC 7.4 4.0 - 10.5 K/uL   RBC 4.13 3.87 - 5.11 MIL/uL   Hemoglobin 9.7 (L) 12.0 - 15.0 g/dL   HCT 30.8 (L) 36.0 - 46.0 %   MCV 74.6 (L) 80.0 - 100.0 fL   MCH 23.5 (L) 26.0 - 34.0 pg   MCHC 31.5 30.0 - 36.0 g/dL   RDW 15.9 (H) 11.5 - 15.5 %   Platelets 323 150 - 400 K/uL   nRBC 0.0 0.0 - 0.2 %    Patient Active Problem List   Diagnosis Date Noted   Short interval between pregnancies affecting pregnancy in third trimester, antepartum 12/05/2021   BMI 40s 12/05/2021   Obesity in pregnancy 12/05/2021   Alpha thalassemia silent carrier 08/20/2021   Dermoid cyst 08/15/2021   Eczema 07/27/2021   Asthma, mild intermittent 07/27/2021   History of benign brain tumor 07/11/2021   Supervision of other normal pregnancy, antepartum  07/10/2021   Hx of preeclampsia, prior pregnancy, currently pregnant 10/03/2020   Endometriosis 06/07/2016   ADHD (attention deficit hyperactivity disorder) 07/17/2013    Assessment/Plan:  Nichole Nae Hoskinson is a 21 y.o. G2P1001 at 64w6dhere for IOL for BMI >40  #Labor:latent #Pain: Analgesia/anesthesia prn #FWB: Cat 1 #ID:  GBS neg #MOF: breast #MOC: undecided  Admit to LD Cervical ripening  Anticipate SVD  [name], [place] [facility] [employer] [employer] 01/07/2022, 11:46 AM

## 2022-01-08 ENCOUNTER — Other Ambulatory Visit: Payer: Self-pay

## 2022-01-08 ENCOUNTER — Encounter (HOSPITAL_COMMUNITY): Payer: Self-pay | Admitting: Obstetrics and Gynecology

## 2022-01-08 DIAGNOSIS — Z3A39 39 weeks gestation of pregnancy: Secondary | ICD-10-CM

## 2022-01-08 MED ORDER — OXYCODONE HCL 5 MG PO TABS
5.0000 mg | ORAL_TABLET | Freq: Once | ORAL | Status: AC
  Administered 2022-01-08: 5 mg via ORAL
  Filled 2022-01-08: qty 1

## 2022-01-08 MED ORDER — WITCH HAZEL-GLYCERIN EX PADS: 1.0000 | MEDICATED_PAD | CUTANEOUS | Status: DC | PRN

## 2022-01-08 MED ORDER — DIBUCAINE (PERIANAL) 1 % EX OINT: 1.0000 | TOPICAL_OINTMENT | CUTANEOUS | Status: DC | PRN

## 2022-01-08 MED ORDER — FUROSEMIDE 20 MG PO TABS
20.0000 mg | ORAL_TABLET | Freq: Every day | ORAL | Status: DC
  Administered 2022-01-08: 20 mg via ORAL

## 2022-01-08 MED ORDER — PRENATAL MULTIVITAMIN CH
1.0000 | ORAL_TABLET | Freq: Every day | ORAL | Status: DC
  Administered 2022-01-08 – 2022-01-09 (×2): 1 via ORAL
  Filled 2022-01-08 (×2): qty 1

## 2022-01-08 MED ORDER — ZOLPIDEM TARTRATE 5 MG PO TABS: 5.0000 mg | ORAL_TABLET | Freq: Every evening | ORAL | Status: DC | PRN

## 2022-01-08 MED ORDER — TETANUS-DIPHTH-ACELL PERTUSSIS 5-2.5-18.5 LF-MCG/0.5 IM SUSY: 0.5000 mL | PREFILLED_SYRINGE | Freq: Once | INTRAMUSCULAR | Status: DC

## 2022-01-08 MED ORDER — SENNOSIDES-DOCUSATE SODIUM 8.6-50 MG PO TABS
2.0000 | ORAL_TABLET | ORAL | Status: DC
  Administered 2022-01-08 – 2022-01-09 (×2): 2 via ORAL
  Filled 2022-01-08 (×2): qty 2

## 2022-01-08 MED ORDER — IBUPROFEN 600 MG PO TABS
600.0000 mg | ORAL_TABLET | Freq: Four times a day (QID) | ORAL | Status: DC
  Administered 2022-01-08 – 2022-01-09 (×5): 600 mg via ORAL
  Filled 2022-01-08 (×5): qty 1

## 2022-01-08 MED ORDER — NIFEDIPINE ER OSMOTIC RELEASE 30 MG PO TB24
ORAL_TABLET | ORAL | Status: AC
  Filled 2022-01-08: qty 1

## 2022-01-08 MED ORDER — ONDANSETRON HCL 4 MG/2ML IJ SOLN: 4.0000 mg | INTRAMUSCULAR | Status: DC | PRN

## 2022-01-08 MED ORDER — FUROSEMIDE 20 MG PO TABS
ORAL_TABLET | ORAL | Status: AC
  Filled 2022-01-08: qty 1

## 2022-01-08 MED ORDER — COCONUT OIL OIL: 1.0000 | TOPICAL_OIL | Status: DC | PRN

## 2022-01-08 MED ORDER — DIPHENHYDRAMINE HCL 25 MG PO CAPS: 25.0000 mg | ORAL_CAPSULE | Freq: Four times a day (QID) | ORAL | Status: DC | PRN

## 2022-01-08 MED ORDER — SODIUM CHLORIDE 0.9% FLUSH: 3.0000 mL | Freq: Two times a day (BID) | INTRAVENOUS | Status: DC

## 2022-01-08 MED ORDER — MEASLES, MUMPS & RUBELLA VAC IJ SOLR: 0.5000 mL | Freq: Once | INTRAMUSCULAR | Status: DC

## 2022-01-08 MED ORDER — BENZOCAINE-MENTHOL 20-0.5 % EX AERO: 1.0000 | INHALATION_SPRAY | CUTANEOUS | Status: DC | PRN

## 2022-01-08 MED ORDER — SIMETHICONE 80 MG PO CHEW: 80.0000 mg | CHEWABLE_TABLET | ORAL | Status: DC | PRN

## 2022-01-08 MED ORDER — ACETAMINOPHEN 325 MG PO TABS
650.0000 mg | ORAL_TABLET | ORAL | Status: DC | PRN
  Administered 2022-01-08 (×2): 650 mg via ORAL
  Filled 2022-01-08 (×2): qty 2

## 2022-01-08 MED ORDER — SODIUM CHLORIDE 0.9 % IV SOLN: 250.0000 mL | INTRAVENOUS | Status: DC | PRN

## 2022-01-08 MED ORDER — NIFEDIPINE ER OSMOTIC RELEASE 30 MG PO TB24
30.0000 mg | ORAL_TABLET | Freq: Every day | ORAL | Status: DC
  Administered 2022-01-08: 30 mg via ORAL

## 2022-01-08 MED ORDER — SODIUM CHLORIDE 0.9% FLUSH: 3.0000 mL | INTRAVENOUS | Status: DC | PRN

## 2022-01-08 MED ORDER — LIDOCAINE HCL (PF) 1 % IJ SOLN
INTRAMUSCULAR | Status: DC | PRN
  Administered 2022-01-07 (×2): 4 mL via EPIDURAL

## 2022-01-08 MED ORDER — ONDANSETRON HCL 4 MG PO TABS: 4.0000 mg | ORAL_TABLET | ORAL | Status: DC | PRN

## 2022-01-08 NOTE — Anesthesia Postprocedure Evaluation (Signed)
Anesthesia Post Note  Patient: Te Nae Vogan  Procedure(s) Performed: AN AD HOC LABOR EPIDURAL     Patient location during evaluation: Mother Baby Anesthesia Type: Epidural Level of consciousness: awake and alert Pain management: pain level controlled Vital Signs Assessment: post-procedure vital signs reviewed and stable Respiratory status: spontaneous breathing, nonlabored ventilation and respiratory function stable Cardiovascular status: stable Postop Assessment: no headache, no backache and epidural receding Anesthetic complications: no   No notable events documented.  Last Vitals:  Vitals:   01/08/22 1040 01/08/22 1142  BP: 131/72 121/79  Pulse: 77 82  Resp: 18 16  Temp: 36.7 C 36.7 C  SpO2: 100% 99%    Last Pain:  Vitals:   01/08/22 1142  TempSrc: Oral  PainSc: 3    Pain Goal:                   Silvino Selman

## 2022-01-08 NOTE — Anesthesia Preprocedure Evaluation (Signed)
Anesthesia Evaluation  Patient identified by MRN, date of birth, ID band Patient awake    Reviewed: Allergy & Precautions, Patient's Chart, lab work & pertinent test results  History of Anesthesia Complications Negative for: history of anesthetic complications  Airway Mallampati: II  TM Distance: >3 FB Neck ROM: Full    Dental no notable dental hx.    Pulmonary asthma ,    Pulmonary exam normal        Cardiovascular negative cardio ROS Normal cardiovascular exam     Neuro/Psych negative neurological ROS     GI/Hepatic negative GI ROS, Neg liver ROS,   Endo/Other  Morbid obesity  Renal/GU negative Renal ROS  negative genitourinary   Musculoskeletal negative musculoskeletal ROS (+)   Abdominal   Peds  Hematology  (+) Blood dyscrasia (Hgb 9.7), anemia ,   Anesthesia Other Findings Day of surgery medications reviewed with patient.  Reproductive/Obstetrics (+) Pregnancy                             Anesthesia Physical Anesthesia Plan  ASA: 3  Anesthesia Plan: Epidural   Post-op Pain Management:    Induction:   PONV Risk Score and Plan: Treatment may vary due to age or medical condition  Airway Management Planned: Natural Airway  Additional Equipment: Fetal Monitoring  Intra-op Plan:   Post-operative Plan:   Informed Consent: I have reviewed the patients History and Physical, chart, labs and discussed the procedure including the risks, benefits and alternatives for the proposed anesthesia with the patient or authorized representative who has indicated his/her understanding and acceptance.       Plan Discussed with:   Anesthesia Plan Comments:         Anesthesia Quick Evaluation

## 2022-01-08 NOTE — Progress Notes (Signed)
Labor Progress Note Nichole [name] is a 21 y.o. G2P1001 at 79w6dpresented for IOL 2/2 BMI >40.   S: Doing well, no concerns at this time. Resting in bed.   O:  BP (!) 104/55   Pulse 75   Temp 98.4 F (36.9 C) (Oral)   Resp 18   Ht '5\' 7"'$  (1.702 m)   LMP 03/03/2021 (Exact Date)   SpO2 100%   BMI 41.97 kg/m  EFM: 110bpm/moderate/+accels, no decels  CVE: Dilation: 6.5 Effacement (%): 90 Station: -2 Presentation: Vertex Exam by:: ASidonie Dickens RN   A&P: 21y.o. G2P1001 344w6dere for IOL 2/2 BMI >40.  #Labor: Progressing well. Pitocin at 6. Consider AROM and IUPC if unchanged from last check.  #Pain: Epidural #FWB: Cat I #GBS negative   Nichole Mcglinn Autry-Lott, DO 6:04 AM

## 2022-01-08 NOTE — Anesthesia Procedure Notes (Signed)
Epidural Patient location during procedure: OB Start time: 01/07/2022 11:27 PM End time: 01/07/2022 11:30 PM  Staffing Anesthesiologist: Brennan Bailey, MD Performed: anesthesiologist   Preanesthetic Checklist Completed: patient identified, IV checked, risks and benefits discussed, monitors and equipment checked, pre-op evaluation and timeout performed  Epidural Patient position: sitting Prep: DuraPrep and site prepped and draped Patient monitoring: continuous pulse ox, blood pressure and heart rate Approach: midline Location: L3-L4 Injection technique: LOR air  Needle:  Needle type: Tuohy  Needle gauge: 17 G Needle length: 9 cm Needle insertion depth: 6 cm Catheter type: closed end flexible Catheter size: 19 Gauge Catheter at skin depth: 11 cm Test dose: negative and Other (1% lidocaine)  Assessment Events: blood not aspirated, injection not painful, no injection resistance, no paresthesia and negative IV test  Additional Notes Patient identified. Risks, benefits, and alternatives discussed with patient including but not limited to bleeding, infection, nerve damage, paralysis, failed block, incomplete pain control, headache, blood pressure changes, nausea, vomiting, reactions to medication, itching, and postpartum back pain. Confirmed with bedside nurse the patient's most recent platelet count. Confirmed with patient that they are not currently taking any anticoagulation, have any bleeding history, or any family history of bleeding disorders. Patient expressed understanding and wished to proceed. All questions were answered. Sterile technique was used throughout the entire procedure. Please see nursing notes for vital signs.   Crisp LOR on first pass. Test dose was given through epidural catheter and negative prior to continuing to dose epidural or start infusion. Warning signs of high block given to the patient including shortness of breath, tingling/numbness in hands, complete  motor block, or any concerning symptoms with instructions to call for help. Patient was given instructions on fall risk and not to get out of bed. All questions and concerns addressed with instructions to call with any issues or inadequate analgesia.  Reason for block:procedure for pain

## 2022-01-08 NOTE — Discharge Summary (Signed)
Postpartum Discharge Summary     Patient Name: Nichole [name] DOB: 2001/01/13 MRN: [id number]  Date of admission: 01/07/2022 Delivery date:01/08/2022  Delivering provider: Shelda Pal  Date of discharge: 01/08/2022  Admitting diagnosis: Obesity in pregnancy [O99.210] Intrauterine pregnancy: [redacted]w[redacted]d    Secondary diagnosis:  Principal Problem:   Obesity in pregnancy Active Problems:   Alpha thalassemia silent carrier  Additional problems: N/A    Discharge diagnosis: Term Pregnancy Delivered                                              Post partum procedures: None Augmentation: Pitocin, Cytotec, and IP Foley Complications: None  Hospital course: Induction of Labor With Vaginal Delivery   21y.o. yo G2P1001 at 462w0das admitted to the hospital 01/07/2022 for induction of labor.  Indication for induction: Elective and BMI>40 .  Patient had an uncomplicated labor course as follows: Membrane Rupture Time/Date: 4:13 AM ,01/08/2022   Delivery Method:Vaginal, Spontaneous  Episiotomy: None  Lacerations:  None  Details of delivery can be found in separate delivery note.  Patient had a routine postpartum course. Patient is discharged home 01/08/22.  Newborn Data: Birth date:01/08/2022  Birth time:8:33 AM  Gender:Female  Living status:Living  Apgars:8 ,9  Weight:   Magnesium Sulfate received: No BMZ received: No Rhophylac:N/A MMR:N/A T-DaP:Given prenatally Flu: N/A Transfusion:No  Physical exam  Vitals:   01/08/22 0630 01/08/22 0702 01/08/22 0732 01/08/22 0802  BP: 128/60 132/74 123/75 131/63  Pulse: 88 83 (!) 103 (!) 152  Resp:      Temp:      TempSrc:      SpO2:      Height:       General: alert, cooperative, and no distress Lochia: appropriate Uterine Fundus: firm Incision: N/A DVT Evaluation: No evidence of DVT seen on physical exam. Calf/Ankle edema is present Labs: Lab Results  Component Value Date   WBC 7.4 01/07/2022   HGB 9.7 (L) 01/07/2022    HCT 30.8 (L) 01/07/2022   MCV 74.6 (L) 01/07/2022   PLT 323 01/07/2022      Latest Ref Rng & Units 10/31/2021    3:46 PM  CMP  Glucose 70 - 99 mg/dL 104   BUN 6 - 20 mg/dL 8   Creatinine 0.57 - 1.00 mg/dL 0.63   Sodium 134 - 144 mmol/L 136   Potassium 3.5 - 5.2 mmol/L 4.2   Chloride 96 - 106 mmol/L 104   CO2 20 - 29 mmol/L 18   Calcium 8.7 - 10.2 mg/dL 8.7   Total Protein 6.0 - 8.5 g/dL 6.9   Total Bilirubin 0.0 - 1.2 mg/dL <0.2   Alkaline Phos 42 - 106 IU/L 83   AST 0 - 40 IU/L 10   ALT 0 - 32 IU/L 6    Edinburgh Score:     No data to display           After visit meds:  Allergies as of 01/09/2022       Reactions   Peanut (diagnostic) Hives        Medication List     STOP taking these medications    loratadine 10 MG tablet Commonly known as: Claritin   terconazole 0.4 % vaginal cream Commonly known as: TERAZOL 7       TAKE these medications  furosemide 20 MG tablet Commonly known as: LASIX Take 1 tablet (20 mg total) by mouth daily. Start taking on: January 10, 2022   hydrOXYzine 25 MG capsule Commonly known as: Vistaril Take 1 capsule (25 mg total) by mouth 3 (three) times daily as needed. What changed:  when to take this reasons to take this   multivitamin-prenatal 27-0.8 MG Tabs tablet Take 1 tablet by mouth daily at 12 noon.   NIFEdipine 30 MG 24 hr tablet Commonly known as: ADALAT CC Take 1 tablet (30 mg total) by mouth daily. Start taking on: January 10, 2022   simethicone 80 MG chewable tablet Commonly known as: MYLICON Chew 1 tablet (80 mg total) by mouth as needed for flatulence.         Discharge home in stable condition Infant Feeding: Bottle and Breast Infant Disposition:home with mother Discharge instruction: per After Visit Summary and Postpartum booklet. Activity: Advance as tolerated. Pelvic rest for 6 weeks.  Diet: routine diet Future Appointments: Future Appointments  Date Time Provider [place]   01/23/2022  1:15 PM [place] [facility]   Follow up Visit:  Message  sent on 01/08/22 to [place] by Shelda Pal, DO Please schedule this patient for a In person postpartum visit in 6 weeks with the following provider: Any provider. Additional Postpartum F/U: none   Low risk pregnancy complicated by:  FQH>22 Delivery mode:  Vaginal, Spontaneous  Anticipated Birth Control:  Unsure   01/09/2022 Maryann Conners, CNM

## 2022-01-08 NOTE — Lactation Note (Signed)
This note was copied from a baby's chart. Lactation Consultation Note  Patient Name: Nichole [name] Date: 01/08/2022   Age:21 hours  RN stated baby had already breastfed.  Lactation to follow up on MBU.    Vivianne Master [facility] 01/08/2022, 9:57 AM

## 2022-01-08 NOTE — Progress Notes (Signed)
Labor Progress Note [name] is a 21 y.o. G2P1001 at 51w6dpresented for IOL 2/2 BMI >40.   S: Doing well, no concerns at this time. Resting in bed.   O:  BP 132/74   Pulse 83   Temp 98 F (36.7 C) (Oral)   Resp 20   Ht '5\' 7"'$  (1.702 m)   LMP 03/03/2021 (Exact Date)   SpO2 100%   BMI 41.97 kg/m  EFM: 115bpm/moderate/+accels, early decels CVE: Dilation: 10 Dilation Complete Date: 01/08/22 Dilation Complete Time: [id number] (%): 100 Station: 0 Presentation: Vertex Exam by:: Dr. AErskine Speed  A&P: 21y.o. [id number] for IOL 2/2 BMI >40.  #Labor: Progressing well. In throne position; laboring down prior to pushing. SROM estimated to be around 4AM.  #Pain: Epidural #FWB: Cat II, early decels #GBS negative   [name], DO 7:39 AM

## 2022-01-08 NOTE — Lactation Note (Signed)
This note was copied from a baby's chart. Lactation Consultation Note  Patient Name: Nichole [name] Date: 01/08/2022 Reason for consult: Initial assessment;Term (P2 , exp BF , as LC entered the room baby latched on the Rt Br with depth and swallows, per Birthing parent comfortable.) Age:21 hours Baby ended up feeding for 20 mins. Stooled changed x 1 after baby fed,   Maternal Data Has patient been taught Hand Expression?:  (per mom exp and feels comfortable) Does the patient have breastfeeding experience prior to this delivery?: Yes How long did the patient breastfeed?: 6 months old  Feeding Mother's Current Feeding Choice: Breast Milk  LATCH Score  Latched with depth,flanged lips, swallows.   Lactation Tools Discussed/Used  None   Interventions - Breast feeding review,     Discharge Pump: DEBP;Personal WIC Program: Yes  Consult Status Consult Status: Follow-up Date: 01/09/22 Follow-up type: In-patient    [place] 01/08/2022, 3:01 PM

## 2022-01-09 ENCOUNTER — Encounter: Payer: Medicaid Other | Admitting: Obstetrics and Gynecology

## 2022-01-09 ENCOUNTER — Other Ambulatory Visit (HOSPITAL_COMMUNITY): Payer: Self-pay

## 2022-01-09 ENCOUNTER — Other Ambulatory Visit: Payer: Medicaid Other

## 2022-01-09 LAB — CBC
HCT: 28.5 % — ABNORMAL LOW (ref 36.0–46.0)
Hemoglobin: 9 g/dL — ABNORMAL LOW (ref 12.0–15.0)
MCH: 23.9 pg — ABNORMAL LOW (ref 26.0–34.0)
MCHC: 31.6 g/dL (ref 30.0–36.0)
MCV: 75.8 fL — ABNORMAL LOW (ref 80.0–100.0)
Platelets: 299 10*3/uL (ref 150–400)
RBC: 3.76 MIL/uL — ABNORMAL LOW (ref 3.87–5.11)
RDW: 16 % — ABNORMAL HIGH (ref 11.5–15.5)
WBC: 12.4 10*3/uL — ABNORMAL HIGH (ref 4.0–10.5)
nRBC: 0 % (ref 0.0–0.2)

## 2022-01-09 LAB — BIRTH TISSUE RECOVERY COLLECTION (PLACENTA DONATION)

## 2022-01-09 MED ORDER — FUROSEMIDE 20 MG PO TABS
20.0000 mg | ORAL_TABLET | Freq: Every day | ORAL | Status: DC
Start: 2022-01-08 — End: 2022-01-09
  Administered 2022-01-09: 20 mg via ORAL
  Filled 2022-01-09: qty 1

## 2022-01-09 MED ORDER — FUROSEMIDE 20 MG PO TABS
20.0000 mg | ORAL_TABLET | Freq: Every day | ORAL | 0 refills | Status: DC
  Filled 2022-01-09: qty 4, 4d supply, fill #0

## 2022-01-09 MED ORDER — NIFEDIPINE ER 30 MG PO TB24
30.0000 mg | ORAL_TABLET | Freq: Every day | ORAL | 1 refills | Status: DC
  Filled 2022-01-09: qty 30, 30d supply, fill #0

## 2022-01-09 MED ORDER — SIMETHICONE 80 MG PO CHEW
80.0000 mg | CHEWABLE_TABLET | ORAL | 0 refills | Status: DC | PRN
  Filled 2022-01-09: qty 30, 30d supply, fill #0

## 2022-01-09 MED ORDER — NIFEDIPINE ER OSMOTIC RELEASE 30 MG PO TB24
30.0000 mg | ORAL_TABLET | Freq: Every day | ORAL | Status: DC
  Administered 2022-01-09: 30 mg via ORAL
  Filled 2022-01-09: qty 1

## 2022-01-09 NOTE — Lactation Note (Signed)
This note was copied from a baby's chart. Lactation Consultation Note  Patient Name: Nichole [name] Date: 01/09/2022 Reason for consult: Follow-up assessment Age:21 hours  P2, Birth parent complaining of soreness with latching. Assisted with hand positioning and head support to increase latch depth.  Comfort improved. Reviewed engorgement care and monitoring voids/stools. Noted baby has tight mid anterior lingual frenulum. Provided resource sheet if needed if future problems.   Feeding Mother's Current Feeding Choice: Breast Milk  LATCH Score Latch: Grasps breast easily, tongue down, lips flanged, rhythmical sucking.  Audible Swallowing: A few with stimulation  Type of Nipple: Everted at rest and after stimulation  Comfort (Breast/Nipple): Filling, red/small blisters or bruises, mild/mod discomfort  Hold (Positioning): Assistance needed to correctly position infant at breast and maintain latch.  LATCH Score: 7   Interventions Interventions: Breast feeding basics reviewed;Assisted with latch;Skin to skin;Hand express;Education  Discharge Discharge Education: Engorgement and breast care;Warning signs for feeding baby  Consult Status Consult Status: Complete Date: 01/10/22    Nichole [name] [facility] 01/09/2022, 11:58 AM
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
## 2022-01-09 NOTE — Progress Notes (Signed)
POSTPARTUM PROGRESS NOTE  Post Partum Day 2  Subjective:  Nichole [name] is a 21 y.o. [id number] s/p SVD at [redacted]w[redacted]d  No acute events overnight.  Pt denies problems with ambulating, voiding or po intake.  She denies nausea or vomiting.  Pain is well controlled.  She has had flatus. She has not had bowel movement.  Lochia Minimal. She is currently breastfeeding well and has no questions or concerns.  Objective: Blood pressure 111/64, pulse 77, temperature 98.6 F (37 C), resp. rate 16, height '5\' 7"'$  (1.702 m), last menstrual period 03/03/2021, SpO2 99 %, unknown if currently breastfeeding.  Physical Exam:  General: alert, cooperative and no distress Chest: no respiratory distress Heart:regular rate, distal pulses intact Abdomen: soft, nontender,  Uterine Fundus: firm, appropriately tender DVT Evaluation: No calf swelling or tenderness Extremities: 1+ edema Skin: warm, dry; incision clean/dry/intact  Recent Labs    01/07/22 1037 01/09/22 0433  HGB 9.7* 9.0*  HCT 30.8* 28.5*    Assessment/Plan: Nichole Nae GTrivediis a 21y.o. [id number]/p SVD at 440w0dShe is doing well and would like to d/c today. She requires no assistance for lactation at this time and plans to f/u with PCP for birth control.  PPD# - Doing well Contraception: OCP, will f/u with PCP Feeding: breast, doing well Dispo: Plan for discharge 8/16.   LOS: 2 days   FoMarcelino Duster, Medical Student, CNM 01/09/2022, 8:02 AM

## 2022-01-09 NOTE — Progress Notes (Signed)
Per social work no barriers to discharge at this time. Timoteo Ace, RN

## 2022-01-09 NOTE — BH Specialist Note (Unsigned)
Pt did not arrive to video visit and did not answer the phone; Left HIPPA-compliant message to call back Liona Wengert from [facility] at  [phone] (Laneshia Pina's office).  ?; left MyChart message for patient.  ? ?

## 2022-01-12 ENCOUNTER — Encounter: Payer: Self-pay | Admitting: Family Medicine

## 2022-01-13 ENCOUNTER — Encounter: Payer: Self-pay | Admitting: Family Medicine

## 2022-01-16 ENCOUNTER — Encounter: Payer: Self-pay | Admitting: General Practice

## 2022-01-17 ENCOUNTER — Telehealth (HOSPITAL_COMMUNITY): Payer: Self-pay | Admitting: *Deleted

## 2022-01-17 ENCOUNTER — Telehealth: Payer: Self-pay | Admitting: General Practice

## 2022-01-17 DIAGNOSIS — O165 Unspecified maternal hypertension, complicating the puerperium: Secondary | ICD-10-CM

## 2022-01-17 MED ORDER — NIFEDIPINE ER OSMOTIC RELEASE 60 MG PO TB24: 60.0000 mg | ORAL_TABLET | Freq: Every day | ORAL | 0 refills | Status: DC

## 2022-01-17 NOTE — Telephone Encounter (Signed)
Patient called back into front office stating she is returning our phone call. Told patient I was calling to check in on her blood pressure as she has reported several elevated readings at home. Asked patient to check her BP again for me and she reports a reading of 140/82. She does report a headache at this time with some blurry vision. Patient states her vision is completely blurred but is a little bit. Called and discussed with Dr Damita Dunnings who advises patient increase her procardia to '60mg'$  daily, should go ahead and take another pill now then two of her 30 mg tablets tomorrow morning. Patient also needs BP check appt. Discussed with patient. New Rx for procardia '60mg'$  sent to pharmacy. Scheduled nurse visit appt for tomorrow at 10:40. Advised patient if her headache worsens or her vision is consistently blurry to go to [facility] for evaluation. Patient verbalized understanding.

## 2022-01-17 NOTE — Telephone Encounter (Signed)
Called patient regarding elevated BP with symptoms uploaded into babyscripts, no answer- left message stating we are trying to reach her to check in on her, please call us back.

## 2022-01-17 NOTE — Telephone Encounter (Signed)
Chart opened in error.  Odis Hollingshead, RN 01-17-2022 at 4:24pm

## 2022-01-18 ENCOUNTER — Ambulatory Visit: Payer: Medicaid Other

## 2022-01-20 ENCOUNTER — Encounter: Payer: Self-pay | Admitting: Obstetrics and Gynecology

## 2022-01-23 ENCOUNTER — Ambulatory Visit: Payer: Medicaid Other | Admitting: Clinical

## 2022-01-23 DIAGNOSIS — Z91199 Patient's noncompliance with other medical treatment and regimen due to unspecified reason: Secondary | ICD-10-CM

## 2022-01-29 ENCOUNTER — Telehealth: Payer: Self-pay | Admitting: General Practice

## 2022-01-29 NOTE — Telephone Encounter (Signed)
Kamila from [facility] called to let us know she had a visit with the patient earlier today and she scored a 45 on her [place] but denied thoughts of hurting herself or her baby. The patient told her she previously saw [facility] in our office but didn't feel like it was a good fit and she accidentally missed her appt on 8/30 because she was sleeping. Lia Foyer has made a referral to [facility] who should reach out to the patient in 2-3 days. Reviewed patient's upcoming pp visit on 9/21 with her as well.

## 2022-02-11 ENCOUNTER — Ambulatory Visit: Payer: Medicaid Other | Admitting: Medical

## 2022-02-14 ENCOUNTER — Ambulatory Visit (INDEPENDENT_AMBULATORY_CARE_PROVIDER_SITE_OTHER): Payer: Medicaid Other | Admitting: Obstetrics and Gynecology

## 2022-02-14 ENCOUNTER — Encounter: Payer: Self-pay | Admitting: Obstetrics and Gynecology

## 2022-02-14 VITALS — BP 132/86 | HR 91 | Wt 247.0 lb

## 2022-02-14 DIAGNOSIS — Z30017 Encounter for initial prescription of implantable subdermal contraceptive: Secondary | ICD-10-CM

## 2022-02-14 DIAGNOSIS — Z3480 Encounter for supervision of other normal pregnancy, unspecified trimester: Secondary | ICD-10-CM

## 2022-02-14 DIAGNOSIS — D369 Benign neoplasm, unspecified site: Secondary | ICD-10-CM | POA: Diagnosis not present

## 2022-02-14 HISTORY — PX: INSERTION OF IMPLANON ROD: OBO 1005

## 2022-02-14 LAB — POCT PREGNANCY, URINE: Preg Test, Ur: NEGATIVE

## 2022-02-14 MED ORDER — ETONOGESTREL 68 MG ~~LOC~~ IMPL
68.0000 mg | DRUG_IMPLANT | Freq: Once | SUBCUTANEOUS | Status: AC
  Administered 2022-02-14: 68 mg via SUBCUTANEOUS

## 2022-02-14 NOTE — Procedures (Signed)
Nexplanon Insertion Procedure Note Prior to the procedure being performed, the patient was asked to state their full name, date of birth, type of procedure being performed and the exact location of the operative site. This information was then checked against the documentation in the patient's chart. Prior to the procedure being performed, a "time out" was performed by the physician that confirmed the correct patient, procedure and site.  After informed consent was obtained, the patient's non-dominant left arm was chosen for insertion. A site was marked approximately 8 cm proximal to the medial epicondyle in the sulcus between the biceps and triceps on the inner surface. The area was cleaned with alcohol then local anesthesia was infiltrated with 3 ml of 1% lidocaine along the planned insertion track. The area was prepped with betadine. Using sterile technique the Nexplanon device was inserted per manufacturer's guidelines in the subdermal connective tissue using the standard insertion technique without difficulty. Pressure was applied and the insertion site was hemostatic. The presence of the Nexplanon was confirmed immediately after insertion by palpation by both me and the patient and by checking the tip of needle for the absence of the insert.  A pressure dressing was applied.  The patient tolerated the procedure well.  Durene Romans MD Attending Center for [employer] Fish farm manager)

## 2022-02-14 NOTE — Progress Notes (Signed)
    Post Partum Visit Note  Nichole [name] is a 21 y.o. [id number] s/p 8/15 SVD/intact perineum after IOL due to BMI 40s at 54w0dgestational weeks.  Anesthesia: epidural. Postpartum course has been uncomplicated. Baby is doing well. Baby is feeding by breast. Bleeding no bleeding. Bowel function is normal. Bladder function is normal. Patient is not sexually active. Contraception method is abstinence. Postpartum depression screening: negative.   Review of Systems Pertinent items are noted in HPI.  Objective:  LMP 03/03/2021 (Exact Date)    NAD  Assessment/Plan:    1. Postpartum Doing well and no issues. Patient would like another nexplanon today which was placed in the LUE. Patient told to wait a week before considering [facility] I told her to call u[place]xt year for an annual and to start getting paps since she'll be 21  2. Dermoid cyst 1-2cm b/l at her last OB u/s. Will get a follow up scan in late October  CAletha Halim JBrooke BonitoMD Attending Center for [employer](Faculty Practice) 02/14/2022

## 2022-02-18 ENCOUNTER — Other Ambulatory Visit: Payer: Self-pay | Admitting: Obstetrics and Gynecology

## 2022-02-18 DIAGNOSIS — O165 Unspecified maternal hypertension, complicating the puerperium: Secondary | ICD-10-CM

## 2022-03-20 ENCOUNTER — Ambulatory Visit: Payer: Medicaid Other | Attending: Obstetrics and Gynecology

## 2022-04-01 ENCOUNTER — Other Ambulatory Visit: Payer: Self-pay | Admitting: Physician Assistant

## 2022-04-01 DIAGNOSIS — Z9889 Other specified postprocedural states: Secondary | ICD-10-CM

## 2022-04-25 ENCOUNTER — Encounter: Payer: Medicaid Other | Attending: Family | Primary: Family Medicine

## 2022-04-27 ENCOUNTER — Other Ambulatory Visit: Payer: Medicaid Other

## 2022-05-15 ENCOUNTER — Encounter: Payer: Medicaid Other | Attending: Family Medicine | Primary: Family Medicine

## 2022-05-17 ENCOUNTER — Other Ambulatory Visit: Payer: Medicaid Other

## 2022-06-02 ENCOUNTER — Other Ambulatory Visit: Payer: Medicaid Other

## 2022-06-13 DIAGNOSIS — F411 Generalized anxiety disorder: Secondary | ICD-10-CM | POA: Insufficient documentation

## 2022-06-13 DIAGNOSIS — F331 Major depressive disorder, recurrent, moderate: Secondary | ICD-10-CM | POA: Insufficient documentation

## 2022-06-13 DIAGNOSIS — N62 Hypertrophy of breast: Secondary | ICD-10-CM | POA: Insufficient documentation

## 2022-06-13 DIAGNOSIS — Z91010 Allergy to peanuts: Secondary | ICD-10-CM | POA: Insufficient documentation

## 2022-06-13 DIAGNOSIS — G8929 Other chronic pain: Secondary | ICD-10-CM | POA: Insufficient documentation

## 2022-06-13 DIAGNOSIS — D500 Iron deficiency anemia secondary to blood loss (chronic): Secondary | ICD-10-CM | POA: Insufficient documentation

## 2022-07-25 ENCOUNTER — Encounter: Payer: Medicaid Other | Admitting: Bariatrics

## 2022-08-11 ENCOUNTER — Emergency Department: Admit: 2022-08-11

## 2022-08-11 ENCOUNTER — Inpatient Hospital Stay: Admit: 2022-08-11

## 2022-08-11 DIAGNOSIS — S065XAA Subdural hematoma (CMS-HCC: 167): Secondary | ICD-10-CM

## 2022-08-11 DIAGNOSIS — T1490XA Injury, unspecified, initial encounter: Principal | ICD-10-CM

## 2022-08-11 DIAGNOSIS — S0291XA Unspecified fracture of skull, initial encounter for closed fracture: Secondary | ICD-10-CM

## 2022-08-11 DIAGNOSIS — S0219XA Other fracture of base of skull, initial encounter for closed fracture: Secondary | ICD-10-CM

## 2022-08-11 DIAGNOSIS — E876 Hypokalemia: Secondary | ICD-10-CM

## 2022-08-11 MED ORDER — OXYCODONE HCL 5 MG PO TABS
5 mg | ORAL | Status: DC | PRN
Start: 2022-08-11 — End: 2022-08-12

## 2022-08-11 MED ORDER — GLUCOSE 4 G PO CHEW JX
16 g | ORAL | Status: CP | PRN
Start: 2022-08-11 — End: ?

## 2022-08-11 MED ORDER — LEVETIRACETAM IN NACL 500 MG/100ML IV SOLN
Status: CP
Start: 2022-08-11 — End: ?

## 2022-08-11 MED ORDER — POTASSIUM CHLORIDE 20 MEQ/100ML IV SOLN
40 meq | Freq: Once | INTRAVENOUS | Status: CP
Start: 2022-08-11 — End: ?

## 2022-08-11 MED ORDER — METHOCARBAMOL 500 MG PO TABS
500 mg | Freq: Four times a day (QID) | ORAL | Status: DC
Start: 2022-08-11 — End: 2022-08-12

## 2022-08-11 MED ORDER — ONDANSETRON HCL 4 MG/2 ML IJ SOLN SH
4 mg | Freq: Four times a day (QID) | INTRAVENOUS | Status: CP | PRN
Start: 2022-08-11 — End: ?

## 2022-08-11 MED ORDER — MAGNESIUM SULFATE 4 G/100 ML IV SOLN UD JX
4 g | Freq: Once | INTRAVENOUS | Status: CP
Start: 2022-08-11 — End: ?

## 2022-08-11 MED ORDER — ACETAMINOPHEN 500 MG PO TABS
1000 mg | Freq: Four times a day (QID) | ORAL | Status: CP
Start: 2022-08-11 — End: ?

## 2022-08-11 MED ORDER — ACETAMINOPHEN 325 MG PO TABS
650 mg | ORAL | Status: CP | PRN
Start: 2022-08-11 — End: ?

## 2022-08-11 MED ORDER — FENTANYL CITRATE INJ 50 MCG/ML CUSTOM AMP/VIAL
INTRAVENOUS | Status: CP
Start: 2022-08-11 — End: ?

## 2022-08-11 MED ORDER — DEXTROSE 50 % IV SOLN
50 mL | INTRAVENOUS | Status: CP | PRN
Start: 2022-08-11 — End: ?

## 2022-08-11 MED ORDER — PANTOPRAZOLE SODIUM 40 MG IV SOLR
40 mg | Freq: Every day | INTRAVENOUS | Status: CP
Start: 2022-08-11 — End: ?

## 2022-08-11 MED ORDER — LIDOCAINE HCL (PF) 1 % IJ SOLN SH
10 mL | Freq: Once | Status: CP
Start: 2022-08-11 — End: ?

## 2022-08-11 MED ORDER — FENTANYL CITRATE INJ 50 MCG/ML CUSTOM AMP/VIAL
50 ug | Freq: Once | INTRAVENOUS | Status: CP
Start: 2022-08-11 — End: ?

## 2022-08-11 MED ORDER — TETANUS-DIPHTH-ACELL PERTUSSIS 5-2.5-18.5 LF-MCG/0.5 IM SUSY
.5 mL | Freq: Once | INTRAMUSCULAR | Status: CP
Start: 2022-08-11 — End: ?

## 2022-08-11 MED ORDER — IOHEXOL 350 MG/ML IV SOLN SH
100 mL | Freq: Once | INTRAVENOUS | Status: CP
Start: 2022-08-11 — End: ?

## 2022-08-11 MED ORDER — HYDROXYZINE HCL 25 MG PO TABS
25 mg | Freq: Three times a day (TID) | ORAL | Status: CP | PRN
Start: 2022-08-11 — End: ?

## 2022-08-11 MED ORDER — SODIUM CHLORIDE 0.9% FOR FLUSHES
20-180 mL | Status: CP | PRN
Start: 2022-08-11 — End: ?

## 2022-08-11 MED ORDER — MUPIROCIN 2 % EX OINT
Freq: Two times a day (BID) | NASAL | Status: CP
Start: 2022-08-11 — End: ?

## 2022-08-11 MED ORDER — SODIUM CHLORIDE 0.9 % IV SOLN
1000 mL | INTRAVENOUS | Status: CP
Start: 2022-08-11 — End: ?

## 2022-08-11 MED ORDER — ONDANSETRON HCL 4 MG/2 ML IJ SOLN SH
INTRAVENOUS | Status: CP
Start: 2022-08-11 — End: ?

## 2022-08-11 MED ORDER — FENTANYL CITRATE INJ 50 MCG/ML CUSTOM AMP/VIAL
50 ug | INTRAVENOUS | Status: CP | PRN
Start: 2022-08-11 — End: ?

## 2022-08-11 MED ORDER — LEVETIRACETAM IN NACL 1000 MG/100ML IV SOLN
1000 mg | Freq: Once | INTRAVENOUS | Status: CP
Start: 2022-08-11 — End: ?

## 2022-08-11 MED ORDER — LEVETIRACETAM IN NACL 500 MG/100ML IV SOLN
500 mg | Freq: Two times a day (BID) | INTRAVENOUS | Status: CP
Start: 2022-08-11 — End: ?

## 2022-08-11 MED ORDER — SODIUM CHLORIDE 3 % IV SOLN
250 mL | Freq: Once | INTRAVENOUS | Status: CP
Start: 2022-08-11 — End: ?

## 2022-08-11 MED ORDER — ORAL CARBOHYDRATE FOR HYPOGLYCEMIA JX
4-8 [oz_av] | ORAL | Status: CP | PRN
Start: 2022-08-11 — End: ?

## 2022-08-12 MED ORDER — GABAPENTIN 300 MG PO CAPS
300 mg | Freq: Three times a day (TID) | ORAL | Status: CP
Start: 2022-08-12 — End: ?

## 2022-08-12 MED ORDER — POLYETHYLENE GLYCOL 3350 17 G PO PACK
17 g | Freq: Every day | ORAL | Status: CP
Start: 2022-08-12 — End: ?

## 2022-08-12 MED ORDER — MELATONIN 5 MG PO TABS
5 mg | Freq: Every evening | ORAL | Status: CP
Start: 2022-08-12 — End: ?

## 2022-08-12 MED ORDER — OXYCODONE HCL 5 MG PO TABS
5 mg | ORAL | Status: CP | PRN
Start: 2022-08-12 — End: ?

## 2022-08-12 MED ORDER — SODIUM PHOSPHATE IVPB JX
30 mmol | Freq: Once | INTRAVENOUS | Status: CP
Start: 2022-08-12 — End: ?

## 2022-08-12 MED ORDER — MAGNESIUM OXIDE 200 MG PO TAB (INPATIENT)
200 mg | Freq: Every evening | ORAL | Status: CP
Start: 2022-08-12 — End: ?

## 2022-08-12 MED ORDER — METHOCARBAMOL 750 MG PO TABS
750 mg | Freq: Four times a day (QID) | ORAL | Status: CP
Start: 2022-08-12 — End: ?

## 2022-08-12 MED ORDER — HYDROMORPHONE HCL 1 MG/ML IJ SOLN
.2 mg | Freq: Once | INTRAVENOUS | Status: CP
Start: 2022-08-12 — End: ?

## 2022-08-12 MED ORDER — OXYCODONE HCL 10 MG PO TABS
10 mg | ORAL | Status: CP | PRN
Start: 2022-08-12 — End: ?

## 2022-08-12 MED ORDER — LEVETIRACETAM 500 MG PO TABS
500 mg | Freq: Two times a day (BID) | ORAL | Status: CP
Start: 2022-08-12 — End: ?

## 2022-08-12 MED ORDER — DEXMEDETOMIDINE HCL IN NACL 400-0.9 MCG/100 ML-% IV SOLN JX
.2-1.4 ug/kg/h | INTRAVENOUS | Status: DC
Start: 2022-08-12 — End: 2022-08-12

## 2022-08-12 MED ORDER — SENNOSIDES-DOCUSATE SODIUM 8.6-50 MG PO TABS
1 | ORAL_TABLET | Freq: Every evening | ORAL | Status: CP
Start: 2022-08-12 — End: ?

## 2022-08-12 MED ORDER — SODIUM CHLORIDE 3 % IV SOLN
250 mL | Freq: Once | INTRAVENOUS | Status: CP
Start: 2022-08-12 — End: ?

## 2022-08-13 DIAGNOSIS — S065XAA SDH (subdural hematoma) (CMS-HCC: 167): Principal | ICD-10-CM

## 2022-08-13 MED ORDER — HYDROMORPHONE HCL 1 MG/ML IJ SOLN
.2 mg | INTRAVENOUS | Status: DC | PRN
Start: 2022-08-13 — End: 2022-08-13

## 2022-08-13 MED ORDER — K PHOS MONO-SOD PHOS DI & MONO 155-852-130 MG PO TABS
16 mmol | Freq: Once | ORAL | Status: CP
Start: 2022-08-13 — End: ?

## 2022-08-13 MED ORDER — LIDOCAINE PATCH REMOVAL
1 | MEDICATED_PATCH | Freq: Every evening | TRANSDERMAL | Status: DC
Start: 2022-08-13 — End: 2022-08-13

## 2022-08-13 MED ORDER — ENOXAPARIN SODIUM 30 MG/0.3ML IJ SOSY
30 mg | Freq: Two times a day (BID) | SUBCUTANEOUS | Status: CP
Start: 2022-08-13 — End: ?

## 2022-08-13 MED ORDER — HALOPERIDOL LACTATE 5 MG/ML IJ SOLN
5 mg | Freq: Four times a day (QID) | INTRAMUSCULAR | Status: CP | PRN
Start: 2022-08-13 — End: ?

## 2022-08-13 MED ORDER — LIDOCAINE PATCH REMOVAL
1 | MEDICATED_PATCH | Freq: Every evening | TRANSDERMAL | Status: CP
Start: 2022-08-13 — End: ?

## 2022-08-13 MED ORDER — LIDOCAINE 5 % EX PTCH
1 | MEDICATED_PATCH | TRANSDERMAL | Status: CP
Start: 2022-08-13 — End: ?

## 2022-08-13 MED ORDER — LORAZEPAM 0.5 MG PO TABS
1 mg | Freq: Every evening | ORAL | Status: DC
Start: 2022-08-13 — End: 2022-08-14

## 2022-08-13 MED ORDER — BUTALBITAL-APAP-CAFFEINE 50-325-40 MG PO TABS
1 | ORAL_TABLET | Freq: Once | ORAL | Status: CP
Start: 2022-08-13 — End: ?

## 2022-08-13 MED ORDER — LORAZEPAM 0.5 MG PO TABS
1 mg | Freq: Every evening | ORAL | Status: CP
Start: 2022-08-13 — End: ?

## 2022-08-13 MED ORDER — FAMOTIDINE 20 MG PO TABS
20 mg | Freq: Two times a day (BID) | ORAL | Status: CP | PRN
Start: 2022-08-13 — End: ?

## 2022-08-13 MED ORDER — SENNOSIDES-DOCUSATE SODIUM 8.6-50 MG PO TABS
2 | ORAL_TABLET | Freq: Every evening | ORAL | Status: CP
Start: 2022-08-13 — End: ?

## 2022-08-14 MED ORDER — MELATONIN 5 MG PO TABS
10 mg | Freq: Every evening | ORAL | Status: CP
Start: 2022-08-14 — End: ?

## 2022-08-14 MED ORDER — HYDROXYZINE HCL 25 MG PO TABS
50 mg | Freq: Three times a day (TID) | ORAL | Status: CP | PRN
Start: 2022-08-14 — End: ?

## 2022-08-14 MED ORDER — SENNOSIDES-DOCUSATE SODIUM 8.6-50 MG PO TABS
2 | ORAL_TABLET | Freq: Every day | ORAL | Status: CP
Start: 2022-08-14 — End: ?

## 2022-08-14 MED ORDER — TRAZODONE 25 MG PO TAB (INPATIENT)
25 mg | Freq: Every evening | ORAL | Status: CP
Start: 2022-08-14 — End: ?

## 2022-08-14 MED ORDER — MENTHOL (TOPICAL ANALGESIC) 4 % EX GEL
Freq: Four times a day (QID) | TOPICAL | Status: CP | PRN
Start: 2022-08-14 — End: ?

## 2022-08-14 MED ORDER — LEVETIRACETAM 500 MG PO TABS
500 mg | Freq: Two times a day (BID) | ORAL | Status: CP
Start: 2022-08-14 — End: ?

## 2022-08-14 MED ORDER — GABAPENTIN 400 MG PO CAPS
400 mg | Freq: Three times a day (TID) | ORAL | Status: CP
Start: 2022-08-14 — End: ?

## 2022-08-15 MED ORDER — INFLUENZA VAC SPLIT QUAD 0.5 ML IM SUSY
1 | Freq: Once | INTRAMUSCULAR | Status: CP
Start: 2022-08-15 — End: ?

## 2022-08-15 MED ORDER — SIMETHICONE 80 MG PO CHEW
80 mg | Freq: Once | ORAL | Status: CP | PRN
Start: 2022-08-15 — End: ?

## 2022-08-16 DIAGNOSIS — S0219XA Other fracture of base of skull, initial encounter for closed fracture: Principal | ICD-10-CM

## 2022-08-16 MED ORDER — BISACODYL 5 MG PO TBEC
10 mg | Freq: Every day | ORAL | 0 refills | Status: CP
Start: 2022-08-16 — End: ?

## 2022-08-16 MED ORDER — ENOXAPARIN SODIUM 40 MG/0.4ML IJ SOSY
40 mg | Freq: Two times a day (BID) | SUBCUTANEOUS | Status: DC
Start: 2022-08-16 — End: 2022-08-16

## 2022-08-16 MED ORDER — OXYCODONE HCL 5 MG PO TABS
5 mg | ORAL | 0 refills | 30.00000 days | Status: CP | PRN
Start: 2022-08-16 — End: 2022-08-16

## 2022-08-16 MED ORDER — LEVETIRACETAM 500 MG PO TABS
500 mg | Freq: Two times a day (BID) | ORAL | 0 refills | Status: CP
Start: 2022-08-16 — End: ?

## 2022-08-16 MED ORDER — BISACODYL 5 MG PO TBEC
10 mg | Freq: Every day | ORAL | Status: DC
Start: 2022-08-16 — End: 2022-08-16

## 2022-08-16 MED ORDER — METRONIDAZOLE 500 MG PO TABS
500 mg | Freq: Two times a day (BID) | ORAL | Status: DC
Start: 2022-08-16 — End: 2022-08-16

## 2022-08-16 MED ORDER — OXYCODONE HCL 5 MG PO TABS
5 mg | ORAL | 0 refills | 30.00000 days | Status: CP | PRN
Start: 2022-08-16 — End: ?

## 2022-08-16 MED ORDER — GABAPENTIN 400 MG PO CAPS
400 mg | Freq: Three times a day (TID) | ORAL | 0 refills | Status: CP
Start: 2022-08-16 — End: ?

## 2022-08-16 MED ORDER — CEFTRIAXONE SODIUM 1 G IJ SOLR
500 mg | Freq: Once | INTRAMUSCULAR | 0 refills | Status: CP
Start: 2022-08-16 — End: ?

## 2022-08-16 MED ORDER — AZITHROMYCIN 250 MG PO TABS
1000 mg | Freq: Once | ORAL | 0 refills | Status: CP
Start: 2022-08-16 — End: ?

## 2022-08-16 MED ORDER — HYDROXYZINE HCL 50 MG PO TABS
25 mg | Freq: Three times a day (TID) | ORAL | 0 refills | Status: CP | PRN
Start: 2022-08-16 — End: ?

## 2022-08-16 MED ORDER — LEVETIRACETAM 500 MG PO TABS
500 mg | Freq: Two times a day (BID) | ORAL | 0 refills | Status: CP
Start: 2022-08-16 — End: 2022-08-16

## 2022-08-16 MED ORDER — BACLOFEN 10 MG PO TABS
10 mg | Freq: Three times a day (TID) | ORAL | 0 refills | Status: CP
Start: 2022-08-16 — End: ?

## 2022-08-16 MED ORDER — METRONIDAZOLE 500 MG PO TABS
500 mg | Freq: Two times a day (BID) | ORAL | 0 refills | Status: CP
Start: 2022-08-16 — End: ?

## 2022-08-16 MED ORDER — ONDANSETRON 4 MG PO TBDP
4 mg | Freq: Four times a day (QID) | ORAL | Status: DC | PRN
Start: 2022-08-16 — End: 2022-08-16

## 2022-08-16 MED ORDER — ACETAMINOPHEN 500 MG PO TABS
1000 mg | Freq: Four times a day (QID) | ORAL | 0 refills | 17.00000 days | Status: CP
Start: 2022-08-16 — End: ?

## 2022-08-16 MED ORDER — ONDANSETRON 4 MG PO TBDP
4 mg | Freq: Four times a day (QID) | ORAL | 0 refills | Status: CP | PRN
Start: 2022-08-16 — End: ?

## 2022-08-16 MED ORDER — BACLOFEN 10 MG PO TABS
10 mg | Freq: Three times a day (TID) | ORAL | Status: DC
Start: 2022-08-16 — End: 2022-08-16

## 2022-08-20 DIAGNOSIS — A64 Unspecified sexually transmitted disease: Secondary | ICD-10-CM

## 2022-08-20 DIAGNOSIS — M419 Scoliosis, unspecified: Secondary | ICD-10-CM

## 2022-08-20 DIAGNOSIS — F319 Bipolar disorder, unspecified: Secondary | ICD-10-CM

## 2022-08-20 DIAGNOSIS — Z9109 Other allergy status, other than to drugs and biological substances: Secondary | ICD-10-CM

## 2022-08-20 DIAGNOSIS — F329 Major depressive disorder, single episode, unspecified: Secondary | ICD-10-CM

## 2022-08-20 DIAGNOSIS — F909 Attention-deficit hyperactivity disorder, unspecified type: Secondary | ICD-10-CM

## 2022-08-20 DIAGNOSIS — J45909 Unspecified asthma, uncomplicated: Secondary | ICD-10-CM

## 2022-08-20 DIAGNOSIS — G4700 Insomnia, unspecified: Secondary | ICD-10-CM

## 2022-08-20 DIAGNOSIS — F32A Depression: Secondary | ICD-10-CM

## 2022-08-20 DIAGNOSIS — F4310 Post-traumatic stress disorder, unspecified: Secondary | ICD-10-CM

## 2022-08-21 ENCOUNTER — Encounter (HOSPITAL_COMMUNITY): Payer: Self-pay

## 2022-08-21 ENCOUNTER — Emergency Department (HOSPITAL_COMMUNITY)
Admission: EM | Admit: 2022-08-21 | Discharge: 2022-08-21 | Disposition: A | Discharge status: Home / Self Care | Payer: Medicaid Other | Attending: Emergency Medicine | Admitting: Emergency Medicine

## 2022-08-21 ENCOUNTER — Emergency Department (HOSPITAL_COMMUNITY): Payer: Medicaid Other

## 2022-08-21 ENCOUNTER — Other Ambulatory Visit: Payer: Self-pay

## 2022-08-21 DIAGNOSIS — N12 Tubulo-interstitial nephritis, not specified as acute or chronic: Secondary | ICD-10-CM | POA: Diagnosis not present

## 2022-08-21 DIAGNOSIS — R109 Unspecified abdominal pain: Secondary | ICD-10-CM | POA: Diagnosis present

## 2022-08-21 LAB — HIV ANTIBODY (ROUTINE TESTING W REFLEX): HIV Screen 4th Generation wRfx: NONREACTIVE

## 2022-08-21 LAB — BASIC METABOLIC PANEL
Anion gap: 10 (ref 5–15)
BUN: 7 mg/dL (ref 6–20)
CO2: 22 mmol/L (ref 22–32)
Calcium: 9.3 mg/dL (ref 8.9–10.3)
Chloride: 105 mmol/L (ref 98–111)
Creatinine, Ser: 0.76 mg/dL (ref 0.44–1.00)
GFR, Estimated: >60 mL/min (ref >60)
Glucose, Bld: 103 mg/dL — ABNORMAL HIGH (ref 70–99)
Potassium: 3.6 mmol/L (ref 3.5–5.1)
Sodium: 137 mmol/L (ref 135–145)

## 2022-08-21 LAB — URINALYSIS, W/ REFLEX TO CULTURE (INFECTION SUSPECTED)
Bilirubin Urine: NEGATIVE
Glucose, UA: NEGATIVE mg/dL
Hgb urine dipstick: NEGATIVE
Ketones, ur: 20 mg/dL — AB
Nitrite: POSITIVE — AB
Protein, ur: 100 mg/dL — AB
Specific Gravity, Urine: 1.021 (ref 1.005–1.030)
WBC, UA: >50 WBC/hpf (ref 0–5)
pH: 5 (ref 5.0–8.0)

## 2022-08-21 LAB — CBC
HCT: 36.7 % (ref 36.0–46.0)
Hemoglobin: 11.1 g/dL — ABNORMAL LOW (ref 12.0–15.0)
MCH: 24.6 pg — ABNORMAL LOW (ref 26.0–34.0)
MCHC: 30.2 g/dL (ref 30.0–36.0)
MCV: 81.2 fL (ref 80.0–100.0)
Platelets: 443 10*3/uL — ABNORMAL HIGH (ref 150–400)
RBC: 4.52 MIL/uL (ref 3.87–5.11)
RDW: 14.2 % (ref 11.5–15.5)
WBC: 7.8 10*3/uL (ref 4.0–10.5)
nRBC: 0 % (ref 0.0–0.2)

## 2022-08-21 LAB — WET PREP, GENITAL
Clue Cells Wet Prep HPF POC: NONE SEEN
Sperm: NONE SEEN
Trich, Wet Prep: NONE SEEN
WBC, Wet Prep HPF POC: >=10 — AB (ref <10)
Yeast Wet Prep HPF POC: NONE SEEN

## 2022-08-21 LAB — I-STAT BETA HCG BLOOD, ED (MC, WL, AP ONLY): I-stat hCG, quantitative: <5 m[IU]/mL (ref <5)

## 2022-08-21 MED ORDER — HYDROCODONE-ACETAMINOPHEN 5-325 MG PO TABS: 2.0000 | ORAL_TABLET | Freq: Once | ORAL | Status: DC

## 2022-08-21 MED ORDER — SODIUM CHLORIDE 0.9 % IV SOLN
1.0000 g | Freq: Once | INTRAVENOUS | Status: AC
  Administered 2022-08-21: 1 g via INTRAVENOUS
  Filled 2022-08-21: qty 10

## 2022-08-21 MED ORDER — ACETAMINOPHEN 500 MG PO TABS
1000.0000 mg | ORAL_TABLET | Freq: Once | ORAL | Status: AC
  Administered 2022-08-21: 1000 mg via ORAL
  Filled 2022-08-21: qty 2

## 2022-08-21 MED ORDER — KETOROLAC TROMETHAMINE 30 MG/ML IJ SOLN
30.0000 mg | Freq: Once | INTRAMUSCULAR | Status: AC
  Administered 2022-08-21: 30 mg via INTRAMUSCULAR
  Filled 2022-08-21: qty 1

## 2022-08-21 MED ORDER — HYDROCODONE-ACETAMINOPHEN 5-325 MG PO TABS
1.0000 | ORAL_TABLET | Freq: Once | ORAL | Status: AC
  Administered 2022-08-21: 1 via ORAL
  Filled 2022-08-21: qty 1

## 2022-08-21 MED ORDER — SULFAMETHOXAZOLE-TRIMETHOPRIM 800-160 MG PO TABS: 1.0000 | ORAL_TABLET | Freq: Two times a day (BID) | ORAL | 0 refills | Status: AC

## 2022-08-21 MED ORDER — SODIUM CHLORIDE 0.9 % IV BOLUS
500.0000 mL | Freq: Once | INTRAVENOUS | Status: AC
  Administered 2022-08-21: 500 mL via INTRAVENOUS

## 2022-08-21 NOTE — ED Provider Notes (Signed)
[facility] Provider Note   CSN: [id number] Arrival date & time: 08/21/22  1530     History  Chief Complaint  Patient presents with   Flank pain    Te Nae Tra is a 22 y.o. female.  22 y.o female with a PMH  presents to the ED with a chief complaint of bilateral flank pain x last week.  Patient describes bilateral flank pain especially on the right over her left radiating into her right lower abdomen.  Reports a prior history of appendicitis, but states they never removed her appendix?  Also has a history of fibroids and states that she would also like to be checked out for this.  She also had a sore throat last week but this is now feeling better.  She is primarily endorsing some urinary retention, stating that she feels like she is unable to fully void.  She is describing some lower abdominal pressure.  She is currently in a committed relationship and has no concern for sexually transmitted infection.  Although, vaginal discharge has been brown lately. She also reports some nausea. In addition, concern about her blood pressure. No vomiting, no chest pain, no vaginal bleeding.   The history is provided by the patient.       Home Medications Prior to Admission medications   Medication Sig Start Date End Date Taking? Authorizing Provider  sulfamethoxazole-trimethoprim (BACTRIM DS) 800-160 MG tablet Take 1 tablet by mouth 2 (two) times daily for 10 days. 08/21/22 08/31/22 Yes Gayleen Sholtz, Beverley Fiedler, PA-C  furosemide (LASIX) 20 MG tablet Take 1 tablet (20 mg total) by mouth daily. Patient not taking: Reported on 02/14/2022 01/10/22   Gavin Pound, CNM  hydrOXYzine (VISTARIL) 25 MG capsule Take 1 capsule (25 mg total) by mouth 3 (three) times daily as needed. Patient not taking: Reported on 02/14/2022 08/06/21   Elvera Maria, CNM  NIFEdipine (PROCARDIA XL/NIFEDICAL XL) 60 MG 24 hr tablet Take 1 tablet (60 mg total) by mouth daily. Patient not  taking: Reported on 02/14/2022 01/17/22   Radene Gunning, MD  Prenatal Vit-Fe Fumarate-FA (MULTIVITAMIN-PRENATAL) 27-0.8 MG TABS tablet Take 1 tablet by mouth daily at 12 noon.    [provider]  simethicone (MYLICON) 80 MG chewable tablet Chew 1 tablet (80 mg total) by mouth as needed for flatulence. 01/09/22   Gavin Pound, CNM      Allergies    Peanut (diagnostic)    Review of Systems   Review of Systems  Constitutional:  Negative for fever.  HENT:  Negative for sore throat.   Respiratory:  Negative for shortness of breath.   Cardiovascular:  Negative for chest pain.  Gastrointestinal:  Positive for abdominal pain and nausea. Negative for vomiting.  Genitourinary:  Positive for flank pain and vaginal discharge.  Musculoskeletal:  Negative for back pain.  All other systems reviewed and are negative.   Physical Exam Updated Vital Signs BP (!) 147/88 (BP Location: Right Arm)   Pulse 80   Temp 98.1 F (36.7 C)   Resp 18   SpO2 99%  Physical Exam Vitals and nursing note reviewed.  Constitutional:      General: She is not in acute distress.    Appearance: She is well-developed.  HENT:     Head: Normocephalic and atraumatic.     Mouth/Throat:     Pharynx: No oropharyngeal exudate.  Eyes:     Pupils: Pupils are equal, round, and reactive to light.  Cardiovascular:  Rate and Rhythm: Regular rhythm.     Heart sounds: Normal heart sounds.  Pulmonary:     Effort: Pulmonary effort is normal. No respiratory distress.     Breath sounds: Normal breath sounds.  Abdominal:     General: Bowel sounds are normal. There is no distension.     Palpations: Abdomen is soft.     Tenderness: There is abdominal tenderness.  Musculoskeletal:        General: No tenderness or deformity.     Cervical back: Normal range of motion.     Right lower leg: No edema.     Left lower leg: No edema.  Skin:    General: Skin is warm and dry.  Neurological:     Mental Status: She is alert and  oriented to person, place, and time.     ED Results / Procedures / Treatments   Labs (all labs ordered are listed, but only abnormal results are displayed) Labs Reviewed  WET PREP, GENITAL - Abnormal; Notable for the following components:      Result Value   WBC, Wet Prep HPF POC >=10 (*)    All other components within normal limits  BASIC METABOLIC PANEL - Abnormal; Notable for the following components:   Glucose, Bld 103 (*)    All other components within normal limits  CBC - Abnormal; Notable for the following components:   Hemoglobin 11.1 (*)    MCH 24.6 (*)    Platelets 443 (*)    All other components within normal limits  URINALYSIS, W/ REFLEX TO CULTURE (INFECTION SUSPECTED) - Abnormal; Notable for the following components:   APPearance CLOUDY (*)    Ketones, ur 20 (*)    Protein, ur 100 (*)    Nitrite POSITIVE (*)    Leukocytes,Ua MODERATE (*)    Bacteria, UA MANY (*)    All other components within normal limits  HIV ANTIBODY (ROUTINE TESTING W REFLEX)  I-STAT BETA HCG BLOOD, ED ([facility], [facility], [facility] ONLY)  GC/CHLAMYDIA PROBE AMP ([place]) NOT AT [facility]    EKG None  Radiology CT Renal Stone Study  Result Date: 08/21/2022 CLINICAL DATA:  Abdominal/flank pain, stone suspected. EXAM: CT ABDOMEN AND PELVIS WITHOUT CONTRAST TECHNIQUE: Multidetector CT imaging of the abdomen and pelvis was performed following the standard protocol without IV contrast. RADIATION DOSE REDUCTION: This exam was performed according to the departmental dose-optimization program which includes automated exposure control, adjustment of the mA and/or kV according to patient size and/or use of iterative reconstruction technique. COMPARISON:  None Available. FINDINGS: Lower chest: The heart is normal in size and there is a small pericardial effusion. Hepatobiliary: No focal liver abnormality is seen. No gallstones, gallbladder wall thickening, or biliary dilatation. Pancreas: Unremarkable. No pancreatic ductal  dilatation or surrounding inflammatory changes. Spleen: Normal in size without focal abnormality. Adrenals/Urinary Tract: The adrenal glands are within normal limits. No renal calculus or hydronephrosis. The bladder is unremarkable. Stomach/Bowel: Stomach is within normal limits. Appendix appears normal. No evidence of bowel wall thickening, distention, or inflammatory changes. No free air or pneumatosis. A few scattered diverticula are present along the colon without evidence of diverticulitis. Vascular/Lymphatic: No significant vascular findings are present. No enlarged abdominal or pelvic lymph nodes. Reproductive: The uterus is within normal limits. There is a complex fat attenuation structure in the left adnexa measuring 1.7 cm, suspicious for dermoid cyst. Other: No abdominopelvic ascites. Musculoskeletal: Sclerosis is present at the sacroiliac joints bilaterally, compatible with sacroiliitis. No acute or suspicious  osseous abnormality. IMPRESSION: 1. No acute intra-abdominal process. 2. No renal calculus or obstructive uropathy bilaterally. 3. Small pericardial effusion. 4. Complex fat attenuation structure in the left adnexa, suggesting dermoid cyst. Electronically Signed   By: Brett Fairy M.D.   On: 08/21/2022 21:48    Procedures Procedures    Medications Ordered in ED Medications  cefTRIAXone (ROCEPHIN) 1 g in sodium chloride 0.9 % 100 mL IVPB (1 g Intravenous New Bag/Given 08/21/22 2218)  HYDROcodone-acetaminophen (NORCO/VICODIN) 5-325 MG per tablet 1 tablet (1 tablet Oral Given 08/21/22 1630)  ketorolac (TORADOL) 30 MG/ML injection 30 mg (30 mg Intramuscular Given 08/21/22 1959)  sodium chloride 0.9 % bolus 500 mL (500 mLs Intravenous New Bag/Given 08/21/22 2222)    ED Course/ Medical Decision Making/ A&P Clinical Course as of 08/21/22 2224  Wed Aug 21, 2022  1914 WBC, UA: >50 [JS]  1915 Chalmers Guest): MODERATE [JS]  1915 Nitrite(!): POSITIVE [JS]    Clinical Course User Index [JS]  Janeece Fitting, PA-C                             Medical Decision Making Amount and/or Complexity of Data Reviewed Labs: ordered. Decision-making details documented in ED Course. Radiology: ordered.  Risk Prescription drug management.    This patient presents to the ED for concern of BL flank pain, this involves a number of treatment options, and is a complaint that carries with it a high risk of complications and morbidity.  The differential diagnosis includes renal stone, pyelonephritis versus PID.    Co morbidities: Discussed in HPI   Brief History:  See HPI.   EMR reviewed including pt PMHx, past surgical history and past visits to ER.   See HPI for more details   Lab Tests:  I ordered and independently interpreted labs.  The pertinent results include:    I personally reviewed all laboratory work and imaging. Metabolic panel without any acute abnormality specifically kidney function within normal limits and no significant electrolyte abnormalities. CBC without leukocytosis or significant anemia. UA positive nitrate, moderate leukocytes, many bacteria.    Imaging Studies:  CT Renal pending  Medicines ordered:  I ordered medication including toradol, norco  for pain control Reevaluation of the patient after these medicines showed that the patient improved I have reviewed the patients home medicines and have made adjustments as needed  Reevaluation:  After the interventions noted above I re-evaluated patient and found that they have :stayed the same  Social Determinants of Health:  The patient's social determinants of health were a factor in the care of this patient   Problem List / ED Course:  Patient here with multiple days including flank pain, sore throat, urinary urgency which has been ongoing for the past 2 weeks.  Also reports a sore throat that is been ongoing for 2 weeks.  Blood work today was unremarkable.  CMP with a normal creatinine.  She does  have bilateral flank pain, suspicion for renal stone versus pyelonephritis.  Beta-hCG is negative.  Wet prep with greater than 10 white blood cell count, however she denies any concern for sexual transmitted infection.  UA is positive nitrates, moderate leukocytosis, white blood cell count is 50.  CT renal without any acute finding, there is a call for small pericardial effusion noted on CT, bedside ultrasound was performed by attending Dr. Tyrone Nine with no visible fluid noted.  I discussed with patient treating her for pyelonephritis with a dose  of Bactrim for the next 10 days.  She is receiving Rocephin while in the emergency department.  She was also given small bolus to help with hydration.  Patient is hemodynamically stable for discharge. Dispostion:  After consideration of the diagnostic results and the patients response to treatment, I feel that the patent would benefit from treatment of her pyelonephritis with a 10-day course of Bactrim.   Portions of this note were generated with [employer]. Dictation errors may occur despite best attempts at proofreading.   Final Clinical Impression(s) / ED Diagnoses Final diagnoses:  Pyelonephritis    Rx / DC Orders ED Discharge Orders          Ordered    sulfamethoxazole-trimethoprim (BACTRIM DS) 800-160 MG tablet  2 times daily        08/21/22 2143              Janeece Fitting, PA-C 08/21/22 2224    Deno Etienne, DO 08/21/22 [place], [place], DO 08/21/22 2229

## 2022-08-21 NOTE — Discharge Instructions (Addendum)
You were given a prescription for antibiotics in order to treat your infection.  Please take 1 tablet twice a day for the next 10 days.  You experience any fever, vomiting, worsening symptoms you will need to return to the emergency department.

## 2022-08-21 NOTE — ED Triage Notes (Signed)
Pt came in via POV d/t bilateral flanks & pelvic pain. Reports she has fibroids that has not been checked out yet. She also reports having strep throat last week, but that has gotten better. Does still have cold s/s like runny nose, chills, body aches & some n/v, denies fevers. Reports dark & urine with foul odor.

## 2022-08-22 ENCOUNTER — Encounter: Payer: Medicaid Other | Attending: Family Medicine

## 2022-08-22 LAB — GC/CHLAMYDIA PROBE AMP (~~LOC~~) NOT AT ARMC
Chlamydia: NEGATIVE
Comment: NEGATIVE
Comment: NORMAL
Neisseria Gonorrhea: NEGATIVE

## 2022-08-26 ENCOUNTER — Encounter: Payer: Medicaid Other | Attending: Student in an Organized Health Care Education/Training Program

## 2022-08-27 ENCOUNTER — Inpatient Hospital Stay: Payer: Medicaid Other

## 2022-08-28 ENCOUNTER — Encounter: Payer: Medicaid Other | Attending: Family Medicine

## 2022-09-04 ENCOUNTER — Inpatient Hospital Stay: Payer: Medicaid Other

## 2022-09-05 ENCOUNTER — Telehealth: Payer: Self-pay | Admitting: Critical Care Medicine

## 2022-09-05 ENCOUNTER — Ambulatory Visit: Payer: Medicaid Other | Attending: Critical Care Medicine | Admitting: Critical Care Medicine

## 2022-09-05 ENCOUNTER — Encounter: Payer: Self-pay | Admitting: Critical Care Medicine

## 2022-09-05 VITALS — BP 109/78 | HR 95 | Ht 67.0 in | Wt 248.0 lb

## 2022-09-05 DIAGNOSIS — E669 Obesity, unspecified: Secondary | ICD-10-CM | POA: Insufficient documentation

## 2022-09-05 DIAGNOSIS — F331 Major depressive disorder, recurrent, moderate: Secondary | ICD-10-CM | POA: Insufficient documentation

## 2022-09-05 DIAGNOSIS — J4520 Mild intermittent asthma, uncomplicated: Secondary | ICD-10-CM | POA: Insufficient documentation

## 2022-09-05 DIAGNOSIS — L2082 Flexural eczema: Secondary | ICD-10-CM | POA: Diagnosis not present

## 2022-09-05 DIAGNOSIS — D500 Iron deficiency anemia secondary to blood loss (chronic): Secondary | ICD-10-CM | POA: Diagnosis not present

## 2022-09-05 DIAGNOSIS — R1030 Lower abdominal pain, unspecified: Secondary | ICD-10-CM | POA: Diagnosis not present

## 2022-09-05 DIAGNOSIS — Z720 Tobacco use: Secondary | ICD-10-CM

## 2022-09-05 DIAGNOSIS — F411 Generalized anxiety disorder: Secondary | ICD-10-CM | POA: Diagnosis not present

## 2022-09-05 DIAGNOSIS — E569 Vitamin deficiency, unspecified: Secondary | ICD-10-CM | POA: Diagnosis not present

## 2022-09-05 DIAGNOSIS — D320 Benign neoplasm of cerebral meninges: Secondary | ICD-10-CM | POA: Diagnosis not present

## 2022-09-05 DIAGNOSIS — N12 Tubulo-interstitial nephritis, not specified as acute or chronic: Secondary | ICD-10-CM | POA: Diagnosis not present

## 2022-09-05 DIAGNOSIS — Z86011 Personal history of benign neoplasm of the brain: Secondary | ICD-10-CM

## 2022-09-05 DIAGNOSIS — N920 Excessive and frequent menstruation with regular cycle: Secondary | ICD-10-CM | POA: Diagnosis not present

## 2022-09-05 DIAGNOSIS — Z79899 Other long term (current) drug therapy: Secondary | ICD-10-CM | POA: Insufficient documentation

## 2022-09-05 DIAGNOSIS — Z6841 Body Mass Index (BMI) 40.0 and over, adult: Secondary | ICD-10-CM

## 2022-09-05 DIAGNOSIS — F908 Attention-deficit hyperactivity disorder, other type: Secondary | ICD-10-CM | POA: Diagnosis not present

## 2022-09-05 DIAGNOSIS — I3139 Other pericardial effusion (noninflammatory): Secondary | ICD-10-CM | POA: Diagnosis not present

## 2022-09-05 DIAGNOSIS — R339 Retention of urine, unspecified: Secondary | ICD-10-CM | POA: Insufficient documentation

## 2022-09-05 DIAGNOSIS — R110 Nausea: Secondary | ICD-10-CM | POA: Insufficient documentation

## 2022-09-05 DIAGNOSIS — Z5189 Encounter for other specified aftercare: Secondary | ICD-10-CM | POA: Insufficient documentation

## 2022-09-05 HISTORY — DX: Other pericardial effusion (noninflammatory): I31.39

## 2022-09-05 MED ORDER — NICOTINE POLACRILEX 4 MG MT LOZG: LOZENGE | OROMUCOSAL | 4 refills | Status: DC

## 2022-09-05 MED ORDER — ALBUTEROL SULFATE HFA 108 (90 BASE) MCG/ACT IN AERS: 2.0000 | INHALATION_SPRAY | Freq: Four times a day (QID) | RESPIRATORY_TRACT | 1 refills | Status: AC | PRN

## 2022-09-05 MED ORDER — FERROUS GLUCONATE 324 (38 FE) MG PO TABS: 324.0000 mg | ORAL_TABLET | Freq: Every day | ORAL | 1 refills | Status: DC

## 2022-09-05 NOTE — Assessment & Plan Note (Signed)
PHQ-9 GAD-7 elevated no suicidal intentions or thoughts  Patient failed Wellbutrin  Will refer to licensed clinical social work for anxiety counseling

## 2022-09-05 NOTE — Assessment & Plan Note (Signed)
Referral to licensed clinical social work

## 2022-09-05 NOTE — Assessment & Plan Note (Signed)
Reassess vitamin level

## 2022-09-05 NOTE — Patient Instructions (Signed)
Stop smoking use nicotine replacement Resume iron daily Follow lifestyle medicine approach Refill albuterol Echocardiogram and CT head ordered Get pap smear at GYN Ref to our LCSW for counseling made Return Dr Delford Field 4 months        Advice for Weight Management   -For most of [place] the best way to lose weight is by diet management. Generally speaking, diet management means consuming less calories intentionally which over time brings about progressive weight loss.  This can be achieved more effectively by avoiding ultra processed carbohydrates, processed meats, unhealthy fats.    It is critically important to know your numbers: how much calorie you are consuming and how much calorie you need. More importantly, our carbohydrates sources should be unprocessed naturally occurring  complex starch food items.  It is always important to balance nutrition also by  appropriate intake of proteins (mainly plant-based), healthy fats/oils, plenty of fruits and vegetables.    -The American College of Lifestyle Medicine (ACL M) recommends nutrition derived mostly from Whole Food, Plant Predominant Sources example an apple instead of applesauce or apple pie. Eat Plenty of vegetables, Mushrooms, fruits, Legumes, Whole Grains, Nuts, seeds in lieu of processed meats, processed snacks/pastries red meat, poultry, eggs.  Use only water or unsweetened tea for hydration.  The College also recommends the need to stay away from risky substances including alcohol, smoking; obtaining 7-9 hours of restorative sleep, at least 150 minutes of moderate intensity exercise weekly, importance of healthy social connections, and being mindful of stress and seek help when it is overwhelming.     -Sticking to a routine mealtime to eat 3 meals a day and avoiding unnecessary snacks is shown to have a big role in weight control. Under normal circumstances, the only time we burn stored energy is when we are hungry, so allow  some hunger to take  place- hunger means no food between appropriate meal times, only water.  It is not advisable to starve.    -It is better to avoid simple carbohydrates including: Cakes, Sweet Desserts, Ice Cream, Soda (diet and regular), Sweet Tea, Candies, Chips, Cookies, Store Bought Juices, Alcohol in Excess of  1-2 drinks a day, Lemonade,  Artificial Sweeteners, Doughnuts, Coffee Creamers, "Sugar-free" Products, etc, etc.  This is not a complete list...Marland Kitchen.    -Consulting with certified diabetes educators is proven to provide you with the most accurate and current information on diet.  Also, you may be  interested in discussing diet options/exchanges , we can schedule a visit with Norm Salt, RDN, CDE for individualized nutrition education.   -Exercise: If you are able: 30 -60 minutes a day ,4 days a week, or 150 minutes of moderate intensity exercise weekly.    The longer the better if tolerated.  Combine stretch, strength, and aerobic activities.  If you were told in the past that you have high risk for cardiovascular diseases, or if you are currently symptomatic, you may seek evaluation by your heart doctor prior to initiating moderate to intense exercise programs.                                    Additional Care Considerations for Diabetes/Prediabetes     -Diabetes  is a chronic disease.  The most important care consideration is regular follow-up with your diabetes care provider with the goal being avoiding or delaying its complications and to take advantage of advances in medications and  technology.  If appropriate actions are taken early enough, type 2 diabetes can even be reversed.  Seek information from the right source.   - Whole Food, Plant Predominant Nutrition is highly recommended: Eat Plenty of vegetables, Mushrooms, fruits, Legumes, Whole Grains, Nuts, seeds in lieu of processed meats, processed snacks/pastries red meat, poultry, eggs as recommended by [employer] of  Lifestyle Medicine  (ACLM).   -Type 2 diabetes is known to coexist with other important comorbidities such as high blood pressure and high cholesterol.  It is critical to control not only the diabetes but also the high blood pressure and high cholesterol to minimize and delay the risk of complications including coronary artery disease, stroke, amputations, blindness, etc.  The good news is that this diet recommendation for type 2 diabetes is also very helpful for managing high cholesterol and high blood blood pressure.   - Studies showed that people with diabetes will benefit from a class of medications known as ACE inhibitors and statins.  Unless there are specific reasons not to be on these medications, the standard of care is to consider getting one from these groups of medications at an optimal doses.  These medications are generally considered safe and proven to help protect the heart and the kidneys.     - People with diabetes are encouraged to initiate and maintain regular follow-up with eye doctors, foot doctors, dentists , and if necessary heart and kidney doctors.      - It is highly recommended that people with diabetes quit smoking or stay away from smoking, and get yearly  flu vaccine and pneumonia vaccine at least every 5 years.  See above for additional recommendations on exercise, sleep, stress management , and healthy social connections.

## 2022-09-05 NOTE — Assessment & Plan Note (Signed)
    Current smoking consumption amount: Using a vape product consistently all day long  Dicsussion on advise to quit smoking and smoking impacts: Cardiovascular and lung  Patient's willingness to quit: Wants to quit  Methods to quit smoking discussed: Behavioral modification nicotine replacement  Medication management of smoking session drugs discussed: Nicotine lozenge  Resources provided:  AVS  Referral to licensed social work Setting quit date not established  Follow-up arranged 3 months   Time spent counseling the patient: 5 minutes

## 2022-09-05 NOTE — Assessment & Plan Note (Signed)
May need to see cardiology will obtain echocardiogram at this point

## 2022-09-05 NOTE — Assessment & Plan Note (Signed)
The patient will need repeat CT scanning per recommendations of neurosurgery will order
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
## 2022-09-05 NOTE — Assessment & Plan Note (Signed)
Due to excess bleeding from menstrual periods  Patient has follow-up with gynecology  Nexplanon may be removed  Told to take iron supplement

## 2022-09-05 NOTE — Assessment & Plan Note (Signed)
Prior history of eczema and peanut allergy currently she is stable

## 2022-09-05 NOTE — Assessment & Plan Note (Signed)

## 2022-09-05 NOTE — Assessment & Plan Note (Signed)
Monitor off Adderall

## 2022-09-05 NOTE — Telephone Encounter (Signed)
Needs counseling for anxiety I took her off Wellbutrin it was causing too much stimulation she is off Adderall now as well  Also smoking cessation counseling start nicotine replacement

## 2022-09-05 NOTE — Progress Notes (Signed)
Patient has questions about fluid on on heart and medications.

## 2022-09-05 NOTE — Assessment & Plan Note (Signed)
Stable at this time will refill albuterol and monitor

## 2022-09-06 LAB — URINALYSIS
Bilirubin, UA: NEGATIVE
Glucose, UA: NEGATIVE
Ketones, UA: NEGATIVE
Leukocytes,UA: NEGATIVE
Nitrite, UA: NEGATIVE
RBC, UA: NEGATIVE
Specific Gravity, UA: 1.027 (ref 1.005–1.030)
Urobilinogen, Ur: 0.2 mg/dL (ref 0.2–1.0)
pH, UA: 6 (ref 5.0–7.5)

## 2022-09-06 LAB — VITAMIN B12: Vitamin B-12: 553 pg/mL (ref 232–1245)

## 2022-09-06 LAB — VITAMIN D 25 HYDROXY (VIT D DEFICIENCY, FRACTURES): Vit D, 25-Hydroxy: 6.8 ng/mL — ABNORMAL LOW (ref 30.0–100.0)

## 2022-09-06 NOTE — Progress Notes (Signed)
Pt aware, told to take otc vit d. Carly nothing for to do

## 2022-09-09 ENCOUNTER — Encounter: Payer: Medicaid Other | Attending: Family Medicine

## 2022-09-09 ENCOUNTER — Telehealth: Payer: Self-pay

## 2022-09-09 ENCOUNTER — Telehealth: Payer: Self-pay | Admitting: Licensed Clinical Social Worker

## 2022-09-09 NOTE — Telephone Encounter (Signed)
LCSWA called patient today to introduce herself and to assess patients' mental health needs. Patient was referred by PCP for "Needs counseling for anxiety I took her off Wellbutrin it was causing too much stimulation she is off Adderall now as well and Also smoking cessation counseling start nicotine replacement". Pt. stated that she has PTSD, anxiety, Postpartum and ADHD. Stated that she has been through a lot. Pt would like to meet virtual. LCSWA has an appointment schedule for 09/16/22.

## 2022-09-09 NOTE — Telephone Encounter (Signed)
LCSWA called patient today to introduce herself and to assess patients' mental health needs. Patient was referred by PCP for "Needs counseling for anxiety I took her off Wellbutrin it was causing too much stimulation she is off Adderall now as well and Also smoking cessation counseling start nicotine replacement". Pt. stated that she has PTSD, anxiety, Postpartum and ADHD. Stated that she has been through a lot. Pt would like to meet virtual. LCSWA has an appointment schedule for 09/16/22. 

## 2022-09-09 NOTE — Telephone Encounter (Signed)
-----   Message from Storm Frisk, MD sent at 09/06/2022  3:02 PM EDT ----- Pt aware, told to take otc vit d. Nichole [name] nothing for to do

## 2022-09-09 NOTE — Telephone Encounter (Signed)
noted 

## 2022-09-10 ENCOUNTER — Encounter: Payer: Medicaid Other | Admitting: Nurse Practitioner

## 2022-09-16 ENCOUNTER — Ambulatory Visit
Admission: RE | Admit: 2022-09-16 | Discharge: 2022-09-16 | Disposition: A | Discharge status: Home / Self Care | Payer: Medicaid Other | Source: Ambulatory Visit | Attending: Critical Care Medicine | Admitting: Critical Care Medicine

## 2022-09-16 ENCOUNTER — Institutional Professional Consult (permissible substitution): Payer: Medicaid Other | Admitting: Licensed Clinical Social Worker

## 2022-09-16 DIAGNOSIS — D320 Benign neoplasm of cerebral meninges: Secondary | ICD-10-CM

## 2022-09-17 ENCOUNTER — Encounter: Payer: Medicaid Other | Attending: Family

## 2022-09-17 ENCOUNTER — Telehealth: Payer: Self-pay

## 2022-09-17 NOTE — Telephone Encounter (Signed)
Pt was called and is aware of results, DOB was confirmed.  ?

## 2022-09-17 NOTE — Telephone Encounter (Signed)
-----   Message from Roney Jaffe, [place] sent at 09/17/2022  9:22 AM EDT ----- Please call patient and let her know that her CT did not show any acute processes.  CT was performed per Dr. Lynelle Doctor note due to history of craniotomy for meningioma and a follow-up has been recommended.

## 2022-10-04 ENCOUNTER — Emergency Department: Admit: 2022-10-04 | Discharge: 2022-10-04 | Payer: Medicaid Other

## 2022-10-04 ENCOUNTER — Inpatient Hospital Stay: Admit: 2022-10-04 | Discharge: 2022-10-04 | Payer: Medicaid Other

## 2022-10-04 DIAGNOSIS — D649 Anemia, unspecified: Secondary | ICD-10-CM

## 2022-10-04 DIAGNOSIS — J45909 Unspecified asthma, uncomplicated: Secondary | ICD-10-CM

## 2022-10-04 DIAGNOSIS — F909 Attention-deficit hyperactivity disorder, unspecified type: Secondary | ICD-10-CM

## 2022-10-04 DIAGNOSIS — E876 Hypokalemia: Secondary | ICD-10-CM

## 2022-10-04 DIAGNOSIS — G4700 Insomnia, unspecified: Secondary | ICD-10-CM

## 2022-10-04 DIAGNOSIS — F319 Bipolar disorder, unspecified: Secondary | ICD-10-CM

## 2022-10-04 DIAGNOSIS — A64 Unspecified sexually transmitted disease: Secondary | ICD-10-CM

## 2022-10-04 DIAGNOSIS — F32A Depression: Secondary | ICD-10-CM

## 2022-10-04 DIAGNOSIS — R519 Headache: Principal | ICD-10-CM

## 2022-10-04 DIAGNOSIS — M419 Scoliosis, unspecified: Secondary | ICD-10-CM

## 2022-10-04 DIAGNOSIS — F329 Major depressive disorder, single episode, unspecified: Secondary | ICD-10-CM

## 2022-10-04 DIAGNOSIS — Z9109 Other allergy status, other than to drugs and biological substances: Secondary | ICD-10-CM

## 2022-10-04 DIAGNOSIS — F4310 Post-traumatic stress disorder, unspecified: Secondary | ICD-10-CM

## 2022-10-04 MED ORDER — PROCHLORPERAZINE EDISYLATE 10 MG/2ML IJ SOLN
10 mg | Freq: Once | INTRAVENOUS | Status: CP
Start: 2022-10-04 — End: ?

## 2022-10-04 MED ORDER — POTASSIUM CHLORIDE CRYS ER 10 MEQ PO TBCR
10 meq | Freq: Two times a day (BID) | ORAL | 0 refills | Status: CP
Start: 2022-10-04 — End: ?

## 2022-10-04 MED ORDER — DIPHENHYDRAMINE HCL 50 MG/ML IJ SOLN
12.5 mg | Freq: Once | INTRAVENOUS | Status: CP
Start: 2022-10-04 — End: ?

## 2022-10-04 MED ORDER — POTASSIUM CHLORIDE CRYS ER 20 MEQ PO TBCR
40 meq | Freq: Once | ORAL | Status: CP
Start: 2022-10-04 — End: ?

## 2022-10-04 MED ORDER — BUTALBITAL-APAP-CAFFEINE 50-325-40 MG PO TABS
1 | ORAL_TABLET | ORAL | 0 refills | 30.00000 days | Status: CP | PRN
Start: 2022-10-04 — End: ?

## 2022-10-04 MED ORDER — BOLUS IV FLUID JX
Freq: Once | INTRAVENOUS | Status: CP
Start: 2022-10-04 — End: ?

## 2022-10-22 ENCOUNTER — Ambulatory Visit: Payer: Medicaid Other | Admitting: Family Medicine
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
## 2022-10-29 ENCOUNTER — Ambulatory Visit: Payer: Medicaid Other | Attending: Critical Care Medicine | Admitting: Critical Care Medicine

## 2022-10-29 ENCOUNTER — Telehealth: Payer: Self-pay | Admitting: Critical Care Medicine

## 2022-10-29 ENCOUNTER — Encounter: Payer: Self-pay | Admitting: Critical Care Medicine

## 2022-10-29 VITALS — BP 127/83 | HR 83 | Wt 263.4 lb

## 2022-10-29 DIAGNOSIS — D320 Benign neoplasm of cerebral meninges: Secondary | ICD-10-CM

## 2022-10-29 DIAGNOSIS — F331 Major depressive disorder, recurrent, moderate: Secondary | ICD-10-CM

## 2022-10-29 DIAGNOSIS — D259 Leiomyoma of uterus, unspecified: Secondary | ICD-10-CM

## 2022-10-29 DIAGNOSIS — G8929 Other chronic pain: Secondary | ICD-10-CM | POA: Insufficient documentation

## 2022-10-29 DIAGNOSIS — N809 Endometriosis, unspecified: Secondary | ICD-10-CM

## 2022-10-29 DIAGNOSIS — M546 Pain in thoracic spine: Secondary | ICD-10-CM

## 2022-10-29 DIAGNOSIS — D500 Iron deficiency anemia secondary to blood loss (chronic): Secondary | ICD-10-CM

## 2022-10-29 DIAGNOSIS — M5441 Lumbago with sciatica, right side: Secondary | ICD-10-CM

## 2022-10-29 DIAGNOSIS — M5442 Lumbago with sciatica, left side: Secondary | ICD-10-CM

## 2022-10-29 DIAGNOSIS — Z6841 Body Mass Index (BMI) 40.0 and over, adult: Secondary | ICD-10-CM

## 2022-10-29 DIAGNOSIS — Z3009 Encounter for other general counseling and advice on contraception: Secondary | ICD-10-CM

## 2022-10-29 DIAGNOSIS — E569 Vitamin deficiency, unspecified: Secondary | ICD-10-CM

## 2022-10-29 MED ORDER — VITAMIN D (ERGOCALCIFEROL) 1.25 MG (50000 UNIT) PO CAPS: 50000.0000 [IU] | ORAL_CAPSULE | ORAL | 5 refills | Status: AC

## 2022-10-29 MED ORDER — MELOXICAM 7.5 MG PO TABS: 7.5000 mg | ORAL_TABLET | Freq: Every day | ORAL | 0 refills | Status: AC

## 2022-10-29 NOTE — Assessment & Plan Note (Signed)
Have referred patient to our clinical social work

## 2022-10-29 NOTE — Assessment & Plan Note (Signed)
Referral to orthopedic spine will be made and begin meloxicam daily for pain gave patient back exercises as well will obtain x-rays of the lower and upper back

## 2022-10-29 NOTE — Assessment & Plan Note (Signed)
Patient to stay on iron supplement

## 2022-10-29 NOTE — Assessment & Plan Note (Signed)
Currently resolved based on recent CT scan

## 2022-10-29 NOTE — Assessment & Plan Note (Signed)
As per thoracic pain

## 2022-10-29 NOTE — Patient Instructions (Signed)
A non hormonal IUD is an option let us know if you wish to change to that form of contraception  Start meloxicam for back pain  Xrays of back to be made  Referral to weight loss and orthopedic spine clinics made  Take Vit D weekly  See back exercises  Return 3 months

## 2022-10-29 NOTE — Assessment & Plan Note (Signed)
Persist with vitamin D deficiency prescription written for 50,000 units weekly

## 2022-10-29 NOTE — Telephone Encounter (Signed)
Pls see asap  severe depression

## 2022-10-29 NOTE — Assessment & Plan Note (Signed)
Referral to gynecology made 

## 2022-10-29 NOTE — Assessment & Plan Note (Signed)
Referral to weight management sent

## 2022-11-04 ENCOUNTER — Telehealth: Payer: Self-pay | Admitting: Licensed Clinical Social Worker
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
## 2022-11-05 ENCOUNTER — Ambulatory Visit: Payer: Medicaid Other | Admitting: Orthopaedic Surgery

## 2022-11-05 NOTE — Progress Notes (Deleted)
Office Visit Note   Patient: Nichole [name]           Date of Birth: [birth year]           MRN: [id number] Visit Date: 11/05/2022              Requested by: Storm Frisk, MD [address] [place],  [place] [address] PCP: Storm Frisk, MD   Assessment & Plan: Visit Diagnoses: No diagnosis found.  Plan: ***  Follow-Up Instructions: No follow-ups on file.   Orders:  No orders of the defined types were placed in this encounter.  No orders of the defined types were placed in this encounter.     Procedures: No procedures performed   Clinical Data: No additional findings.   Subjective: No chief complaint on file.   HPI  Review of Systems  Constitutional: Negative.   HENT: Negative.    Eyes: Negative.   Respiratory: Negative.    Cardiovascular: Negative.   Endocrine: Negative.   Musculoskeletal: Negative.   Neurological: Negative.   Hematological: Negative.   Psychiatric/Behavioral: Negative.    All other systems reviewed and are negative.   Objective: Vital Signs: There were no vitals taken for this visit.  Physical Exam Vitals and nursing note reviewed.  Constitutional:      Appearance: She is well-developed.  HENT:     Head: Atraumatic.     Nose: Nose normal.  Eyes:     Extraocular Movements: Extraocular movements intact.  Cardiovascular:     Pulses: Normal pulses.  Pulmonary:     Effort: Pulmonary effort is normal.  Abdominal:     Palpations: Abdomen is soft.  Musculoskeletal:     Cervical back: Neck supple.  Skin:    General: Skin is warm.     Capillary Refill: Capillary refill takes less than 2 seconds.  Neurological:     Mental Status: She is alert. Mental status is at baseline.  Psychiatric:        Behavior: Behavior normal.        Thought Content: Thought content normal.        Judgment: Judgment normal.   Ortho Exam  Specialty Comments:  No specialty comments available.  Imaging: No results  found.   PMFS History: Patient Active Problem List   Diagnosis Date Noted  . Chronic bilateral low back pain with bilateral sciatica 10/29/2022  . 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 Vitamin deficiency 09/05/2022  . Nicotine vapor product user 09/05/2022  . Chronic bilateral thoracic back pain 06/13/2022  . GAD (generalized anxiety disorder) 06/13/2022  . Iron deficiency anemia due to chronic blood loss 06/13/2022  . Macromastia 06/13/2022  . Moderate episode of recurrent major depressive disorder (HCC) 06/13/2022  . Peanut allergy 06/13/2022  . BMI 40s 12/05/2021  . Dermoid cyst 08/15/2021  . Eczema 07/27/2021  . Asthma, mild intermittent 07/27/2021  . History of benign brain tumor 07/11/2021  . Chronic daily headache 08/22/2016  . Endometriosis 06/07/2016  . ADHD (attention deficit hyperactivity disorder) 07/17/2013   Past Medical History:  Diagnosis Date  . Alpha thalassemia silent carrier 08/20/2021  . Anemia   . Asthma   . Elevated hemoglobin A1c 11/24/2021   On 6/28- recommended 2hr GTT: scheduled 7/3  . Hypertension   . Pericardial effusion 09/05/2022  . 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 Hypertension Mother   .  Diabetes Mother   . Hypertension Father   . Liver disease Sister        NASH  . Heart disease Maternal Grandmother   . Hypertension Maternal Grandmother   . Diabetes Maternal Grandmother   . HIV Maternal Grandmother     Past Surgical History:  Procedure Laterality Date  . ADENOIDECTOMY    . CRANIOTOMY    . INSERTION OF IMPLANON ROD  02/14/2022  . OTHER SURGICAL HISTORY Bilateral    had "nasal passages shrunk"  . TONSILLECTOMY     Social History   Occupational History  . Not on file  Tobacco Use  . Smoking status: Never  . Smokeless tobacco: Never  Vaping Use  . Vaping Use: Every day  Substance and Sexual Activity  . Alcohol use: Yes    Comment: occ  . Drug use: Never   . Sexual activity: Yes    Birth control/protection: None

## 2022-12-04 ENCOUNTER — Encounter (INDEPENDENT_AMBULATORY_CARE_PROVIDER_SITE_OTHER): Payer: Medicaid Other | Admitting: Family Medicine

## 2022-12-11 ENCOUNTER — Emergency Department (HOSPITAL_COMMUNITY)
Admission: EM | Admit: 2022-12-11 | Discharge: 2022-12-11 | Disposition: A | Discharge status: Home / Self Care | Payer: Medicaid Other | Attending: Emergency Medicine | Admitting: Emergency Medicine

## 2022-12-11 ENCOUNTER — Other Ambulatory Visit: Payer: Self-pay

## 2022-12-11 ENCOUNTER — Emergency Department (HOSPITAL_COMMUNITY): Payer: Medicaid Other

## 2022-12-11 DIAGNOSIS — J45909 Unspecified asthma, uncomplicated: Secondary | ICD-10-CM | POA: Insufficient documentation

## 2022-12-11 DIAGNOSIS — Z79899 Other long term (current) drug therapy: Secondary | ICD-10-CM | POA: Diagnosis not present

## 2022-12-11 DIAGNOSIS — I10 Essential (primary) hypertension: Secondary | ICD-10-CM | POA: Diagnosis not present

## 2022-12-11 DIAGNOSIS — R002 Palpitations: Secondary | ICD-10-CM | POA: Diagnosis not present

## 2022-12-11 DIAGNOSIS — Z7951 Long term (current) use of inhaled steroids: Secondary | ICD-10-CM | POA: Diagnosis not present

## 2022-12-11 DIAGNOSIS — Z91010 Allergy to peanuts: Secondary | ICD-10-CM | POA: Insufficient documentation

## 2022-12-11 DIAGNOSIS — R0789 Other chest pain: Secondary | ICD-10-CM | POA: Insufficient documentation

## 2022-12-11 LAB — CBC
HCT: 36.5 % (ref 36.0–46.0)
Hemoglobin: 10.9 g/dL — ABNORMAL LOW (ref 12.0–15.0)
MCH: 22.8 pg — ABNORMAL LOW (ref 26.0–34.0)
MCHC: 29.9 g/dL — ABNORMAL LOW (ref 30.0–36.0)
MCV: 76.2 fL — ABNORMAL LOW (ref 80.0–100.0)
Platelets: 359 10*3/uL (ref 150–400)
RBC: 4.79 MIL/uL (ref 3.87–5.11)
RDW: 15.7 % — ABNORMAL HIGH (ref 11.5–15.5)
WBC: 5.1 10*3/uL (ref 4.0–10.5)
nRBC: 0 % (ref 0.0–0.2)

## 2022-12-11 LAB — BASIC METABOLIC PANEL
Anion gap: 9 (ref 5–15)
BUN: 12 mg/dL (ref 6–20)
CO2: 23 mmol/L (ref 22–32)
Calcium: 9.2 mg/dL (ref 8.9–10.3)
Chloride: 108 mmol/L (ref 98–111)
Creatinine, Ser: 0.83 mg/dL (ref 0.44–1.00)
GFR, Estimated: >60 mL/min (ref >60)
Glucose, Bld: 105 mg/dL — ABNORMAL HIGH (ref 70–99)
Potassium: 3.7 mmol/L (ref 3.5–5.1)
Sodium: 140 mmol/L (ref 135–145)

## 2022-12-11 LAB — MAGNESIUM
Magnesium: 2.1 mg/dL (ref 1.7–2.4)
Magnesium: 2.1 mg/dL (ref 1.7–2.4)

## 2022-12-11 LAB — TROPONIN I (HIGH SENSITIVITY)
Troponin I (High Sensitivity): <2 ng/L (ref <18)
Troponin I (High Sensitivity): 3 ng/L (ref <18)

## 2022-12-11 LAB — D-DIMER, QUANTITATIVE: D-Dimer, Quant: 0.34 ug/mL-FEU (ref 0.00–0.50)

## 2022-12-11 NOTE — ED Provider Triage Note (Signed)
Emergency Medicine Provider Triage Evaluation Note  Nichole [name] , a 22 y.o. female  was evaluated in triage.  Pt complains of right arm numbness and palpitations for the last week.  Reports she has had a palpitation in the past but these are stronger than normal.  This does not feel like when she had her pericardial effusion in the past.  Also notes higher blood pressures than normal at home.  Denies any fevers, chills, recent vaccines, caffeine intake.  Denies feelings of presyncope, has not had any episodes of syncope.  Review of Systems  Positive: As above Negative: As above  Physical Exam  BP (!) 158/109 (BP Location: Right Arm)   Pulse 97   Temp 98.3 F (36.8 C) (Oral)   Resp 18   Ht 5\' 7"  (1.702 m)   Wt 121.1 kg   LMP 11/10/2022 (Exact Date)   SpO2 99%   BMI 41.82 kg/m  Gen:   Awake, no distress   Resp:  Normal effort  MSK:   Moves extremities without difficulty  Other:  Regular rate and rhythm with no murmurs  Medical Decision Making  Medically screening exam initiated at 1:00 PM.  Appropriate orders placed.  Nichole [name] was informed that the remainder of the evaluation will be completed by another provider, this initial triage assessment does not replace that evaluation, and the importance of remaining in the ED until their evaluation is complete.     Arabella Merles, PA-C 12/11/22 1321

## 2022-12-11 NOTE — ED Triage Notes (Signed)
Pt c/o palpitations and SOB worsening over the last two weeks. States that she has been taking her BP at home and readings 140-150's/90's.

## 2022-12-11 NOTE — Discharge Instructions (Addendum)
Thank you for coming to [facility] Emergency Department. You were seen for palpitations. We did an exam, labs, and imaging, and these showed no acute findings.  If you drink caffeine or use nicotine, please stop as this can exacerbate your symptoms.  Please follow-up with a cardiologist and we have provided a referral, they will call you to make an appointment sometime in the next 1 to 2 weeks.  They may want to place a cardiac monitor as they can monitor your heart rhythm.  Do not hesitate to return to the ED or call 911 if you experience: -Worsening symptoms -Chest pain, shortness of breath -Lightheadedness, passing out -Fevers/chills -Anything else that concerns you

## 2022-12-11 NOTE — ED Provider Notes (Signed)
[place] EMERGENCY DEPARTMENT AT [facility] Provider Note   CSN: [id number] Arrival date & time: 12/11/22  1251     History  Chief Complaint  Patient presents with   Palpitations    Nichole [name] is a 22 y.o. female with PMH as listed below who presents with palpitations ongoing x 1 week. Associated with mild chest pain intermittent and intermittent R arm paresthesias/numbness. Has had these symptoms in the past but these seem stronger than normal. Has had pericardial effusion in the past but this doesn't feel like that. Was not admitted to hospital, did not have any treatment for pericardial effusion.  Denies f/c, cough, SOB, lightheadedness, N/V/D/C, urinary sxs, recent illnesses, has not had any episodes of syncope. Denies EtOH or illicit drugs but does endorse nicotine vape use. Denies caffeine use. No fam hx SCD. No h/o DVT/PE, no leg swelling. She has a nexplanon for birth control.    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 Authorizing Provider  albuterol (VENTOLIN HFA) 108 (90 Base) MCG/ACT inhaler Inhale 2 puffs into the lungs every 6 (six) hours as needed for wheezing or shortness of breath. Patient not taking: Reported on 10/29/2022 09/05/22   Storm Frisk, MD  etonogestrel (NEXPLANON) 68 MG IMPL implant Inject into the skin.    [provider]  meloxicam (MOBIC) 7.5 MG tablet Take 1 tablet (7.5 mg total) by mouth daily. 10/29/22   Storm Frisk, MD  Vitamin D, Ergocalciferol, (DRISDOL) 1.25 MG (50000 UNIT) CAPS capsule Take 1 capsule (50,000 Units total) by mouth every 7 (seven) days. 10/29/22   Storm Frisk, MD       Allergies    Peanut oil and Peanut (diagnostic)    Review of Systems   Review of Systems A 10 point review of systems was performed and is negative unless otherwise reported in HPI.  Physical Exam Updated Vital Signs BP 130/80   Pulse 78   Temp 98 F (36.7 C) (Oral)   Resp 17   Ht 5\' 7"  (1.702 m)   Wt 121.1 kg   LMP 11/10/2022 (Exact Date)   SpO2 100%   BMI 41.82 kg/m  Physical Exam General: Normal appearing female, lying in bed.  HEENT: PERRLA, Sclera anicteric, MMM, trachea midline.  Cardiology: RRR, no murmurs/rubs/gallops. BL radial and DP pulses equal bilaterally.  Resp: Normal respiratory rate and effort. CTAB, no wheezes, rhonchi, crackles.  Abd: Soft, non-tender, non-distended. No rebound tenderness or guarding.  GU: Deferred. MSK: No peripheral edema or signs of trauma. Extremities without deformity or TTP. No cyanosis or clubbing. Skin: warm, dry. No rashes or lesions. Back: No CVA tenderness Neuro: A&Ox4, CNs II-XII grossly intact. MAEs. Sensation grossly intact.  Psych: Normal mood and affect.   ED Results / Procedures / Treatments   Labs (all labs ordered are listed, but only abnormal results are displayed) Labs Reviewed  BASIC METABOLIC PANEL - Abnormal; Notable for the following components:      Result Value   Glucose, Bld 105 (*)    All other components within normal limits  CBC -  Abnormal; Notable for the following components:   Hemoglobin 10.9 (*)    MCV 76.2 (*)    MCH 22.8 (*)    MCHC 29.9 (*)    RDW 15.7 (*)    All other components within normal limits  MAGNESIUM  MAGNESIUM  D-DIMER, QUANTITATIVE  TROPONIN I (HIGH SENSITIVITY)  TROPONIN I (HIGH SENSITIVITY)    EKG EKG Interpretation Date/Time:  Wednesday December 11 2022 13:08:27 EDT Ventricular Rate:  91 PR Interval:  138 QRS Duration:  70 QT Interval:  360 QTC Calculation: 442 R Axis:   40  Text Interpretation: Normal sinus rhythm Normal ECG Confirmed by Vivi Barrack [phone]) on  12/11/2022 4:25:23 PM  Radiology DG Chest 2 View  Result Date: 12/11/2022 CLINICAL DATA:  Palpitations EXAM: CHEST - 2 VIEW COMPARISON:  None Available. FINDINGS: The heart size and mediastinal contours are within normal limits. Both lungs are clear. The visualized skeletal structures are unremarkable. IMPRESSION: No active cardiopulmonary disease. Electronically Signed   By: Judie Petit.  Shick M.D.   On: 12/11/2022 14:01    Procedures Procedures      EMERGENCY DEPARTMENT [place] CARDIAC EXAM "Study: Limited Ultrasound of the Heart and Pericardium"  INDICATIONS:Chest pain and palpitations Multiple views of the heart and pericardium were obtained in real-time with a multi-frequency probe.  PERFORMED NW:GNFAOZ IMAGES ARCHIVED?: No LIMITATIONS:   performed in hallway bed, limited view to ensure patient modesty VIEWS USED: Subcostal 4 chamber INTERPRETATION: Cardiac activity present, Pericardial effusioin absent, and Normal contractility   Medications Ordered in ED Medications - No data to display  ED Course/ Medical Decision Making/ A&P                          Medical Decision Making Amount and/or Complexity of Data Reviewed Labs: ordered. Decision-making details documented in ED Course. Radiology: ordered.    This patient presents to the ED for concern of palpitations, this involves an extensive number of treatment options, and is a complaint that carries with it a high risk of complications and morbidity.  I considered the following differential and admission for this acute, potentially life threatening condition.   MDM:    The differential diagnosis for this patient's palpitations includes but is not limited to:  Considered arrhythmias including narrow and wide-complex tachycardias, AV blocks, PACs/PVCs, sick sinus syndrome, wandering atrial pacemaker - her EKG doesn't show any signs of arrhythmia, and she has NSR also w/o e/o pericarditis.  Considered nonarrhythmic cardiac causes such  as ACS, cardiomyopathy, congestive heart failure, pericarditis/myocarditis, or valvular disease. She has negative troponins 2-3, no murmur on auscultation, no cardiomegaly on CXR. She has h/o pericardial effusion but BSUS does not demonstrate any pericardial effusion today.  Consider side effects of nicotine as patient uses her vape frequently. She has no worsening anemia (Hgb 10.9 at her baseline) or electrolyte derangements, dimer is negative for PE at 0.34. Also consider dehydration, orthostasis, anxiety. She states it is associated with intermittent R arm numbness and intermittent mild CP. These symptoms have been chronic/intermittent for her and patient is extremely well-appearing with equal pulses in both extremities, no FNDs, intact strength/sensation, warm hand with good cap refill. I have very low c/f acute aortic syndrome, UE DVT, vascular occlusion, or other emergency causing those symptoms. Sxs are intermittent and always in same place, no c/f TIA/CVA. She has no HA or observed FNDs to indicate ICH/hydrocephalus. She has no symptoms at this time. Could consider complex migraine or peripheral nerve  sxs, also considered anxiety or functional sxs. Do not believe patient requires further emergent workup here in the ED for this at this time and can f/u o/p. Will refer to cardiology.   Clinical Course as of 12/13/22 0837  Wed Dec 11, 2022  1624 Troponin I (High Sensitivity): <2 Initial trop neg [HN]  1625 Hemoglobin(!): 10.9 BL 9-11 [HN]  1625 Basic metabolic panel(!) Unremarkable in the context of this patient's presentation  [HN]  1914 Neg dimer, Mg okay [HN]  2127 BSUS demonstrates no pericardial effusion. Patient has had some intermittent palpitations while here without any numbness and no lightheadedness. Patient with heart score low. Can f/u o/p with cardiologist. Given referral for possible zio patch/holter monitor. Advised DC caffeine/nicotine. Given specific DC instructions/return  precautions. All questions answered to her satisfaction. [HN]    Clinical Course User Index [HN] Loetta Rough, MD    Labs: I Ordered, and personally interpreted labs.  The pertinent results include:  those listed above  Imaging Studies ordered: CXR ordered from triage.  I independently visualized and interpreted imaging. I agree with the radiologist interpretation  Additional history obtained from chart review.   Reevaluation: After the interventions noted above, I reevaluated the patient and found that they have :improved  Social Determinants of Health: Lives independently  Disposition:  DC w/ discharge instructions/return precautions. All questions answered to patient's satisfaction.    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   I have reviewed the patients home medicines and have made adjustments as needed  Problem List / ED Course: Problem List Items Addressed This Visit   None Visit Diagnoses     Palpitations    -  Primary   Relevant Orders   Ambulatory referral to Cardiology                   This note was created using dictation software, which may contain spelling or grammatical errors.    Loetta Rough, MD 12/13/22 [phone]

## 2022-12-11 NOTE — ED Provider Notes (Incomplete)
EMERGENCY DEPARTMENT AT [facility] Provider Note   CSN: [id number] Arrival date & time: 12/11/22  1251     History {Add pertinent medical, surgical, social history, OB history to HPI:1} Chief Complaint  Patient presents with  . Palpitations    Nichole [name] is a 22 y.o. female with PMH as listed below who presents with ***.   Pt complains of right arm numbness and palpitations for the last week.  Reports she has had a palpitation in the past but these are stronger than normal.  This does not feel like when she had her pericardial effusion in the past.  Also notes higher blood pressures than normal at home.  Denies any fevers, chills, recent vaccines, caffeine intake.  Denies feelings of presyncope, has not had any episodes of syncope.     Past Medical History:  Diagnosis Date  . Alpha thalassemia silent carrier 08/20/2021  . Anemia   . Asthma   . Elevated hemoglobin A1c 11/24/2021   On 6/28- recommended 2hr GTT: scheduled 7/3  . Hypertension   . Pericardial effusion 09/05/2022  . 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 Authorizing Provider  albuterol (VENTOLIN HFA) 108 (90 Base) MCG/ACT inhaler Inhale 2 puffs into the lungs every 6 (six) hours as needed for wheezing or shortness of breath. Patient not taking: Reported on 10/29/2022 09/05/22   Storm Frisk, MD  etonogestrel (NEXPLANON) 68 MG IMPL implant Inject into the skin.    [provider]  meloxicam (MOBIC) 7.5 MG tablet Take 1 tablet (7.5 mg total) by mouth daily. 10/29/22   Storm Frisk, MD  Vitamin D, Ergocalciferol, (DRISDOL) 1.25 MG (50000 UNIT) CAPS capsule Take 1 capsule (50,000 Units total) by mouth every 7 (seven) days. 10/29/22   Storm Frisk, MD      Allergies    Peanut oil and Peanut (diagnostic)    Review of Systems   Review of Systems A 10 point review of systems was performed  and is negative unless otherwise reported in HPI.  Physical Exam Updated Vital Signs BP (!) 158/109 (BP Location: Right Arm)   Pulse 97   Temp 98.3 F (36.8 C) (Oral)   Resp 18   Ht 5\' 7"  (1.702 m)   Wt 121.1 kg   LMP 11/10/2022 (Exact Date)   SpO2 99%   BMI 41.82 kg/m  Physical Exam General: Normal appearing {Desc; female/female:11659}, lying in bed.  HEENT: PERRLA, Sclera anicteric, MMM, trachea midline.  Cardiology: RRR, no murmurs/rubs/gallops. BL radial and DP pulses equal bilaterally.  Resp: Normal respiratory rate and effort. CTAB, no wheezes, rhonchi, crackles.  Abd: Soft, non-tender, non-distended. No rebound tenderness or guarding.  GU: Deferred. MSK: No peripheral edema or signs of trauma. Extremities without deformity or TTP. No cyanosis or clubbing. Skin: warm, dry. No rashes or lesions. Back: No CVA tenderness Neuro: A&Ox4, CNs II-XII grossly intact. MAEs. Sensation grossly intact.  Psych: Normal mood and affect.   ED Results / Procedures / Treatments   Labs (all labs ordered are listed, but only abnormal results are displayed) Labs Reviewed  BASIC METABOLIC PANEL - Abnormal; Notable for the following components:      Result Value   Glucose, Bld 105 (*)    All other components within normal limits  CBC - Abnormal; Notable for the following components:   Hemoglobin 10.9 (*)  MCV 76.2 (*)    MCH 22.8 (*)    MCHC 29.9 (*)    RDW 15.7 (*)    All other components within normal limits  HCG, SERUM, QUALITATIVE  TROPONIN I (HIGH SENSITIVITY)  TROPONIN I (HIGH SENSITIVITY)    EKG None  Radiology DG Chest 2 View  Result Date: 12/11/2022 CLINICAL DATA:  Palpitations EXAM: CHEST - 2 VIEW COMPARISON:  None Available. FINDINGS: The heart size and mediastinal contours are within normal limits. Both lungs are clear. The visualized skeletal structures are unremarkable. IMPRESSION: No active cardiopulmonary disease. Electronically Signed   By: Judie Petit.  Shick M.D.   On:  12/11/2022 14:01    Procedures Procedures  {Document cardiac monitor, telemetry assessment procedure when appropriate:1}  Medications Ordered in ED Medications - No data to display  ED Course/ Medical Decision Making/ A&P                          Medical Decision Making Amount and/or Complexity of Data Reviewed Labs: ordered. Decision-making details documented in ED Course. Radiology: ordered.    This patient presents to the ED for concern of ***, this involves an extensive number of treatment options, and is a complaint that carries with it a high risk of complications and morbidity.  I considered the following differential and admission for this acute, potentially life threatening condition.   MDM:    ***  Clinical Course as of 12/11/22 1643  Wed Dec 11, 2022  1624 Troponin I (High Sensitivity): <2 Initial trop neg [HN]  1625 Hemoglobin(!): 10.9 BL 9-11 [HN]  1625 Basic metabolic panel(!) Unremarkable in the context of this patient's presentation  [HN]    Clinical Course User Index [HN] Loetta Rough, MD    Labs: I Ordered, and personally interpreted labs.  The pertinent results include:  ***  Imaging Studies ordered: I ordered imaging studies including *** I independently visualized and interpreted imaging. I agree with the radiologist interpretation  Additional history obtained from ***.  External records from outside source obtained and reviewed including ***  Cardiac Monitoring: .The patient was maintained on a cardiac monitor.  I personally viewed and interpreted the cardiac monitored which showed an underlying rhythm of: ***  Reevaluation: After the interventions noted above, I reevaluated the patient and found that they have :{resolved/improved/worsened:23923::"improved"}  Social Determinants of Health: .***  Disposition:  ***  Co morbidities that complicate the patient evaluation . Past Medical History:  Diagnosis Date  . Alpha thalassemia  silent carrier 08/20/2021  . Anemia   . Asthma   . Elevated hemoglobin A1c 11/24/2021   On 6/28- recommended 2hr GTT: scheduled 7/3  . Hypertension   . Pericardial effusion 09/05/2022  . 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   I have reviewed the patients home medicines and have made adjustments as needed  Problem List / ED Course: Problem List Items Addressed This Visit   None        {Document critical care time when appropriate:1} {Document review of labs and clinical decision tools ie heart score, Chads2Vasc2 etc:1}  {Document your independent review of radiology images, and any outside records:1} {Document your discussion with family members, caretakers, and with consultants:1} {Document social determinants of health affecting pt's care:1} {Document your decision making why or why not admission, treatments were needed:1}  This note was created using dictation software,  which may contain spelling or grammatical errors.

## 2023-01-26 ENCOUNTER — Other Ambulatory Visit: Payer: Self-pay

## 2023-01-26 ENCOUNTER — Emergency Department (HOSPITAL_COMMUNITY)
Admission: EM | Admit: 2023-01-26 | Discharge: 2023-01-26 | Disposition: A | Discharge status: Home / Self Care | Payer: Medicaid Other | Source: Home / Self Care | Attending: Emergency Medicine | Admitting: Emergency Medicine

## 2023-01-26 ENCOUNTER — Encounter (HOSPITAL_COMMUNITY): Payer: Self-pay

## 2023-01-26 DIAGNOSIS — I10 Essential (primary) hypertension: Secondary | ICD-10-CM | POA: Diagnosis not present

## 2023-01-26 DIAGNOSIS — Z3202 Encounter for pregnancy test, result negative: Secondary | ICD-10-CM | POA: Insufficient documentation

## 2023-01-26 DIAGNOSIS — Z7951 Long term (current) use of inhaled steroids: Secondary | ICD-10-CM | POA: Insufficient documentation

## 2023-01-26 DIAGNOSIS — J45909 Unspecified asthma, uncomplicated: Secondary | ICD-10-CM | POA: Insufficient documentation

## 2023-01-26 LAB — POC URINE PREG, ED: Preg Test, Ur: NEGATIVE

## 2023-01-26 NOTE — Discharge Instructions (Addendum)
Your pregnancy test is negative today. I recommend close follow-up with [facility] public health department for reevaluation.  Please do not hesitate to return to emergency department if worrisome signs symptoms we discussed become apparent.

## 2023-01-26 NOTE — ED Provider Notes (Signed)
[place] EMERGENCY DEPARTMENT AT [facility] Provider Note   CSN: [id number] Arrival date & time: 01/26/23  1147     History {Add pertinent medical, surgical, social history, OB history to HPI:1} Chief Complaint  Patient presents with   Possible Pregnancy    Nichole [name] is a 22 y.o. female presents today for a pregnancy test.  Patient has a pregnancy test today and she thought it was positive.  Last menstrual cycle was on 01/09/2023.  Endorses unprotected sexual intercourse.  Denies any abdominal pain or cramping or vaginal discharge.  Reports some spotting.   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 Authorizing Provider  albuterol (VENTOLIN HFA) 108 (90 Base) MCG/ACT inhaler Inhale 2 puffs into the lungs every 6 (six) hours as needed for wheezing or shortness of breath. Patient not taking: Reported on 10/29/2022 09/05/22   Storm Frisk, MD  etonogestrel (NEXPLANON) 68 MG IMPL implant Inject into the skin.    [provider]  meloxicam (MOBIC) 7.5 MG tablet Take 1 tablet (7.5 mg total) by mouth daily. 10/29/22   Storm Frisk, MD  Vitamin D, Ergocalciferol, (DRISDOL) 1.25 MG (50000 UNIT) CAPS capsule Take 1 capsule (50,000 Units total) by mouth every 7 (seven) days. 10/29/22   Storm Frisk, MD      Allergies    Peanut oil and Peanut (diagnostic)    Review of Systems   Review of  Systems  Physical Exam Updated Vital Signs BP (!) 160/97 (BP Location: Right Arm)   Pulse (!) 111   Temp 98.9 F (37.2 C) (Oral)   Resp 16   Ht 5\' 7"  (1.702 m)   Wt 122.5 kg   SpO2 100%   BMI 42.29 kg/m  Physical Exam  ED Results / Procedures / Treatments   Labs (all labs ordered are listed, but only abnormal results are displayed) Labs Reviewed  POC URINE PREG, ED    EKG None  Radiology No results found.  Procedures Procedures  {Document cardiac monitor, telemetry assessment procedure when appropriate:1}  Medications Ordered in ED Medications - No data to display  ED Course/ Medical Decision Making/ A&P   {   Click here for ABCD2, HEART and other calculatorsREFRESH Note before signing :1}                              Medical Decision Making  ***  {Document critical care time when appropriate:1} {Document review of labs and clinical decision tools ie heart score, Chads2Vasc2 etc:1}  {Document your independent review of radiology images, and any outside records:1} {Document your discussion with family members, caretakers, and with consultants:1} {Document social determinants of health affecting pt's care:1} {Document your decision making why or why not admission, treatments were needed:1} Final  Clinical Impression(s) / ED Diagnoses Final diagnoses:  None    Rx / DC Orders ED Discharge Orders     None

## 2023-01-26 NOTE — ED Notes (Signed)
Pt left wo notifying  nursing staff.

## 2023-01-26 NOTE — ED Triage Notes (Signed)
Pt arrived POV from home stating she took a pregnancy test at home and wants to confirm if she is truly pregnant or not because she saw a line.

## 2023-02-10 ENCOUNTER — Ambulatory Visit: Payer: Medicaid Other | Admitting: Internal Medicine

## 2023-02-14 ENCOUNTER — Inpatient Hospital Stay

## 2023-02-14 DIAGNOSIS — M545 Acute bilateral low back pain without sciatica: Principal | ICD-10-CM

## 2023-02-14 MED ORDER — METHOCARBAMOL 500 MG PO TABS
500 mg | Freq: Three times a day (TID) | ORAL | 0 refills | Status: CP | PRN
Start: 2023-02-14 — End: ?

## 2023-02-14 MED ORDER — METHOCARBAMOL 500 MG PO TABS
500 mg | Freq: Once | ORAL | Status: CP
Start: 2023-02-14 — End: ?

## 2023-02-14 MED ORDER — KETOROLAC TROMETHAMINE 15 MG/ML IJ SOLN
15 mg | Freq: Once | INTRAMUSCULAR | Status: CP
Start: 2023-02-14 — End: ?

## 2023-02-27 IMAGING — US US OB COMP LESS 14 WK
1 series · 15 of 21 positions shown · non-contrast
Comparison: None.

CLINICAL DATA: Vaginal bleeding for 1 week, pain.

EXAM:
OBSTETRIC <14 WK ULTRASOUND
TECHNIQUE: Transabdominal ultrasound was performed for evaluation of the
gestation as well as the maternal uterus and adnexal regions.

[Series 1: us ob comp less 14 wk · 15 of 21 slices shown]
[im 1/21]
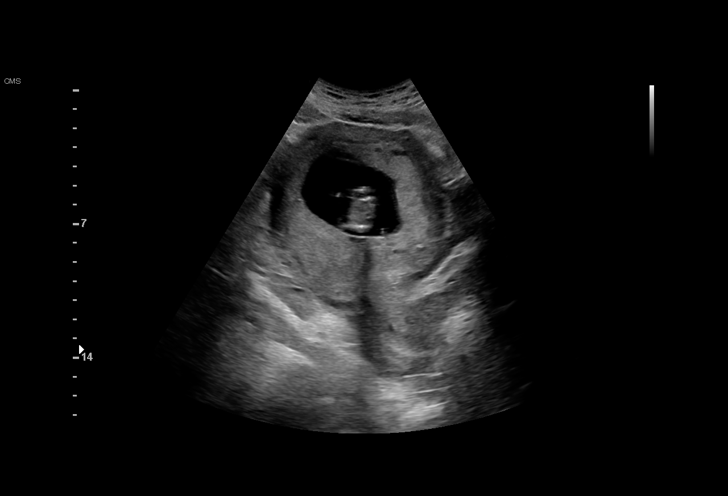
[im 3/21]
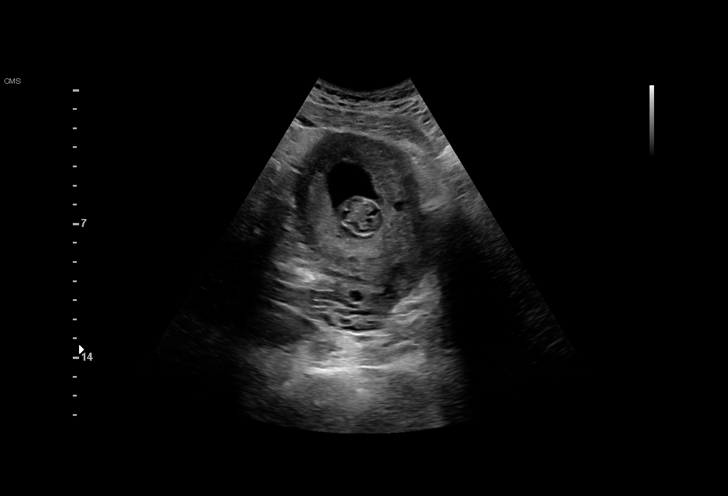
[im 4/21]
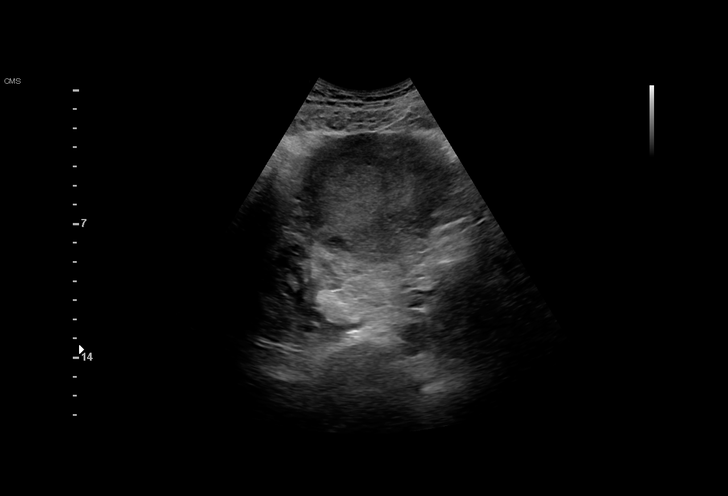
[im 5/21]
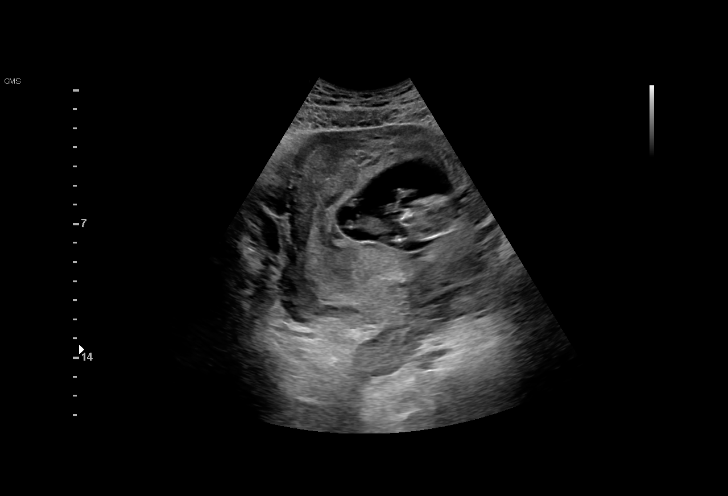
[im 7/21]
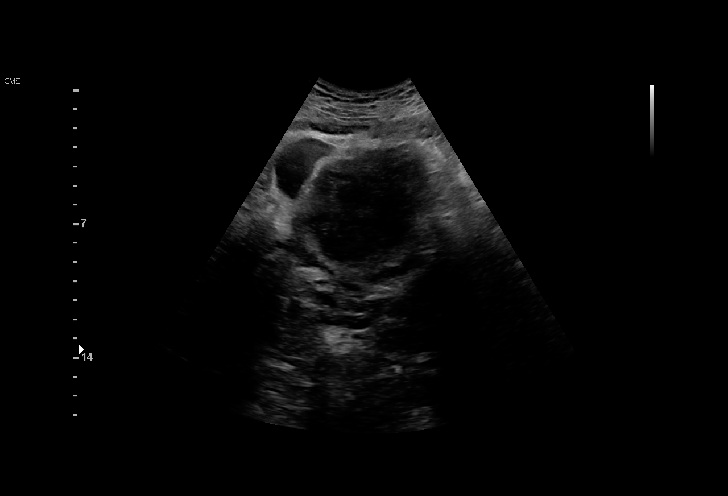
[im 8/21]
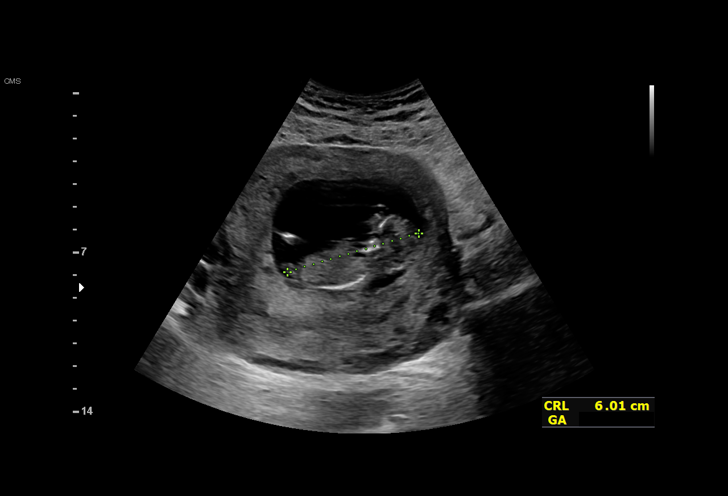
[im 10/21]
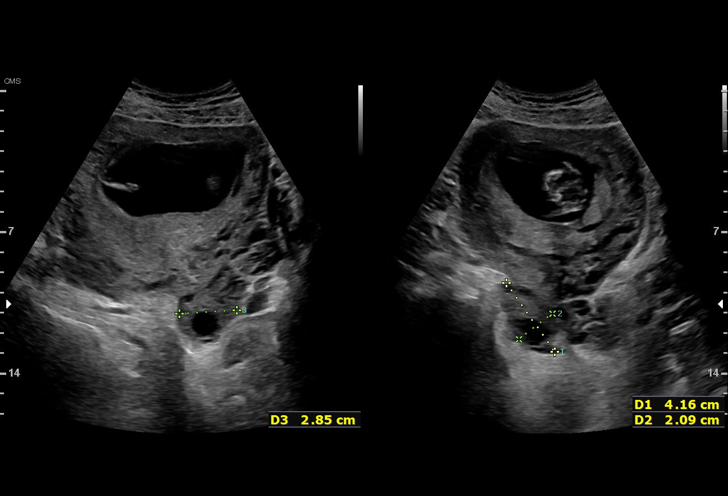
[im 11/21]
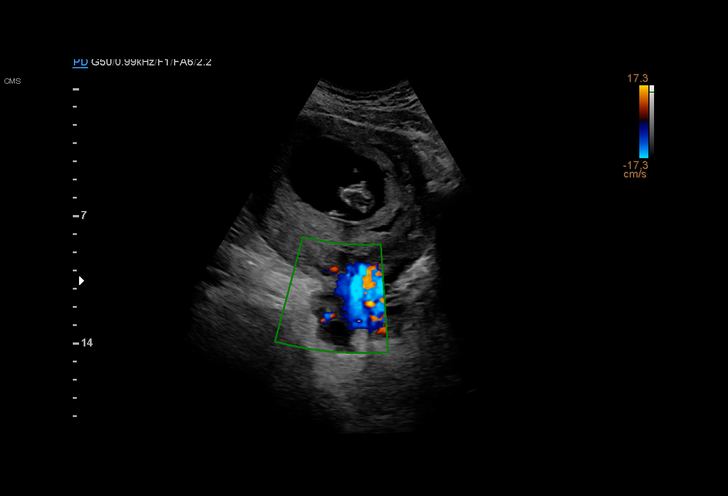
[im 12/21]
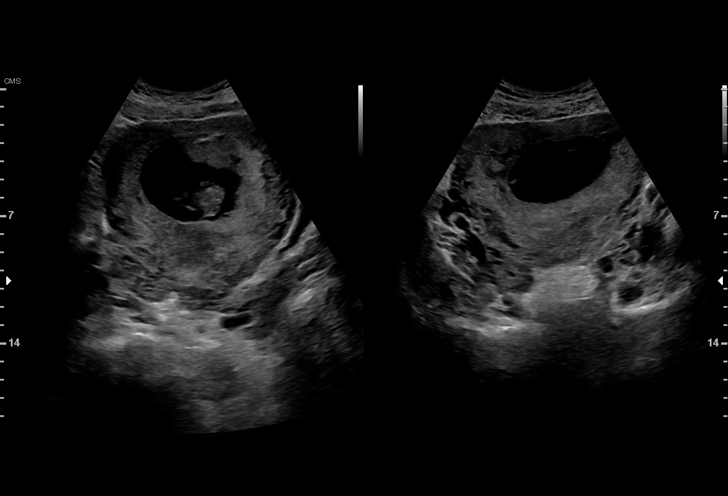
[im 14/21]
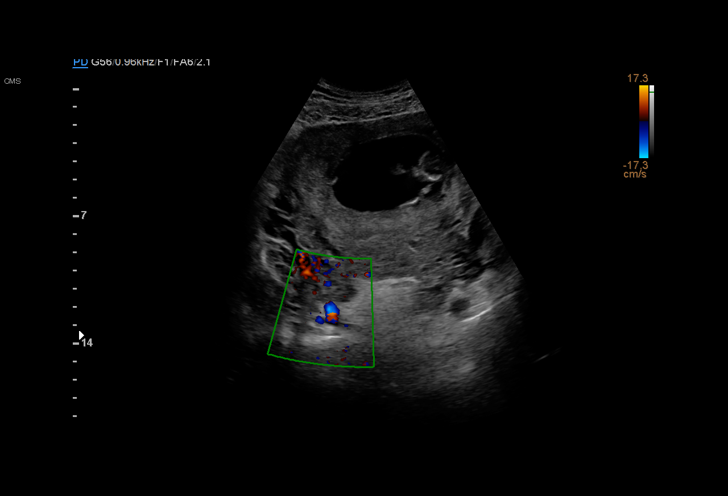
[im 15/21]
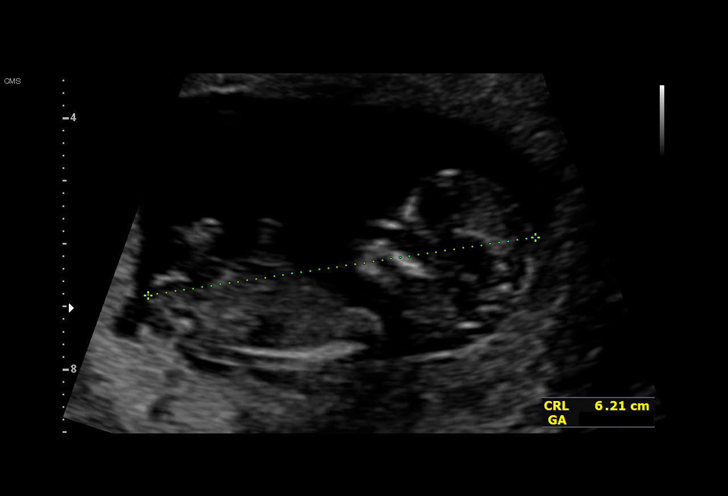
[im 17/21]
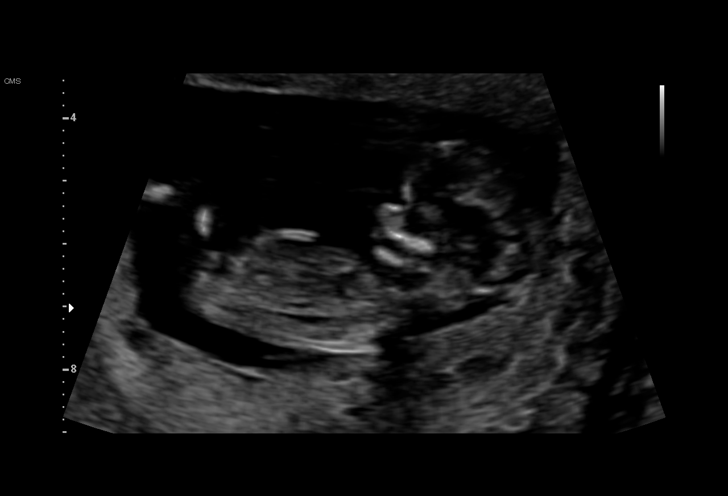
[im 18/21]
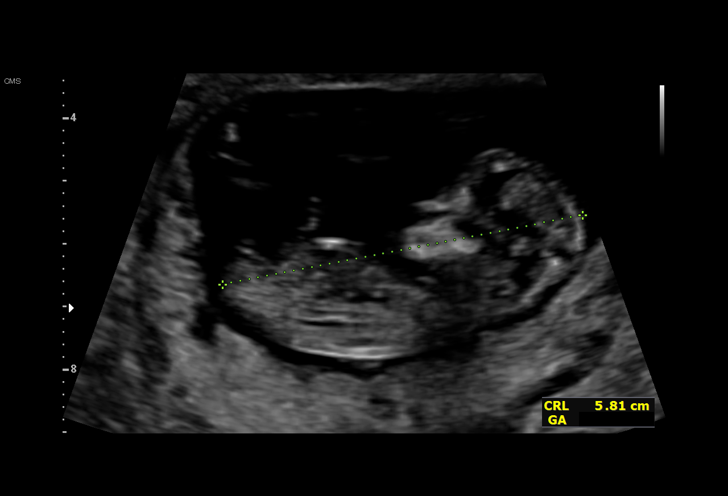
[im 19/21]
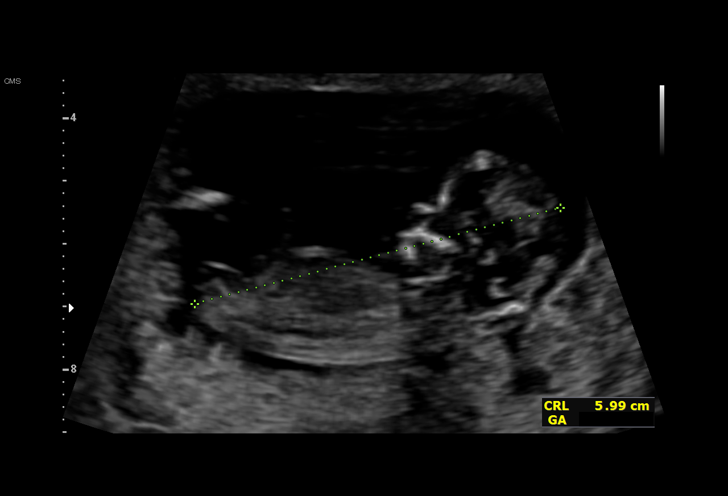
[im 21/21]
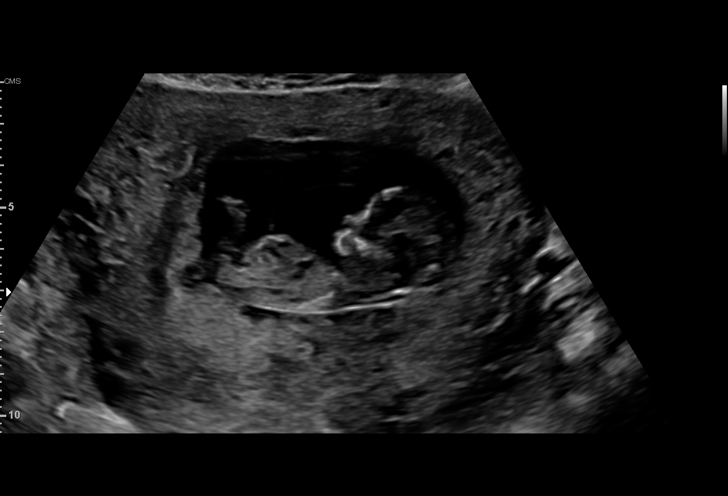

[15 of 21 positions shown; findings below may reference images not displayed]

FINDINGS: Intrauterine gestational sac: Single

Yolk sac:  Not Visualized.

Embryo:  Visualized.

Cardiac Activity: Visualized.

Heart Rate: 167 bpm

CRL:   59 mm   12 w 3 d                  US EDC: 01/08/2022

Subchorionic hemorrhage:  None visualized.

Maternal uterus/adnexae: Normal
IMPRESSION: 1. Single live intrauterine gestation with approximate gestational
age of 12 weeks and 3 days, EDC based on today's sonogram is
01/08/2022.
2. No sonographic evidence of subchorionic hemorrhage.
3. Uterus and adnexa are unremarkable.

## 2023-04-14 ENCOUNTER — Ambulatory Visit: Payer: Medicaid Other | Admitting: Obstetrics and Gynecology

## 2023-04-14 IMAGING — US US MFM OB DETAIL+14 WK
1 series · 13 of 28 positions shown · non-contrast
Comparison: none

[Series 1: us mfm ob detail+14 wk · 112 acquisitions, 13 frames shown]
[im 5/112]
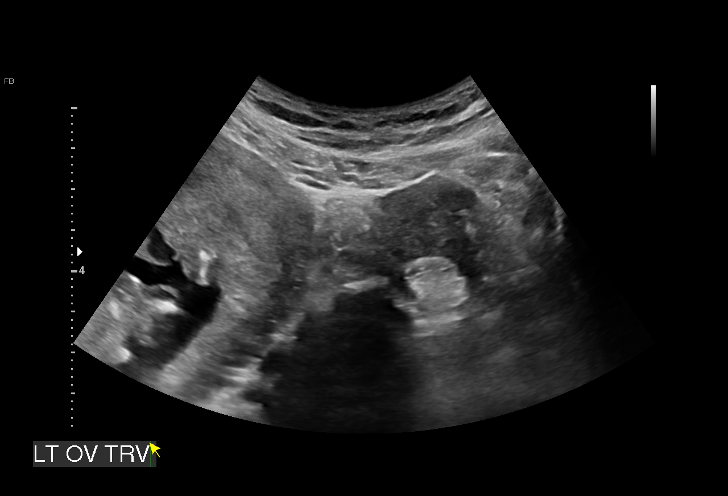
[im 13/112]
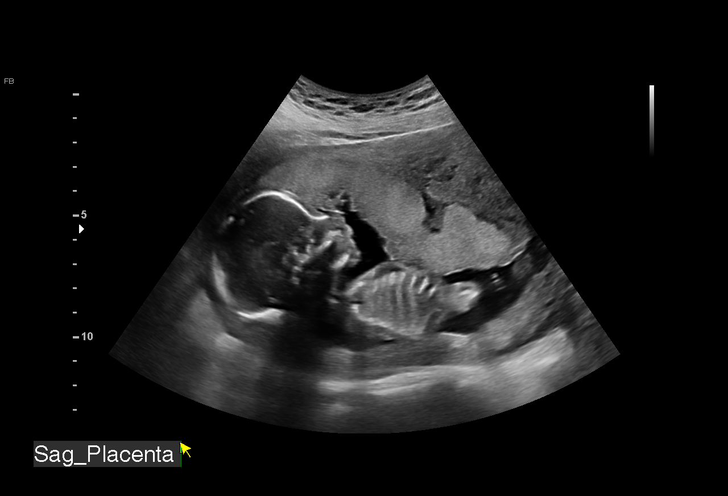
[im 21/112]
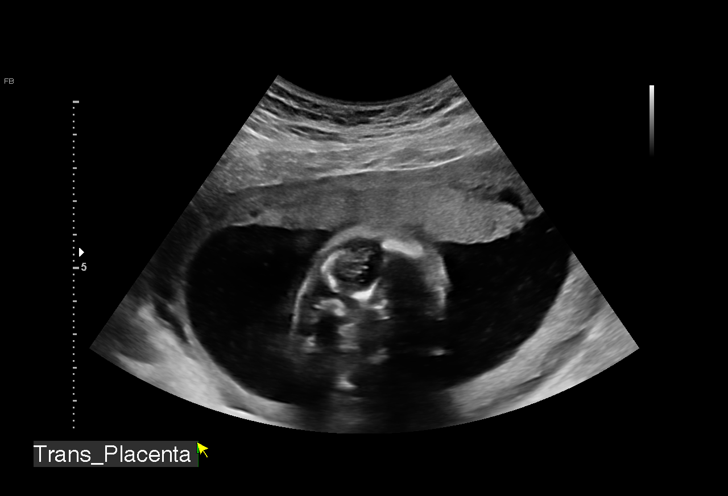
[im 29/112]
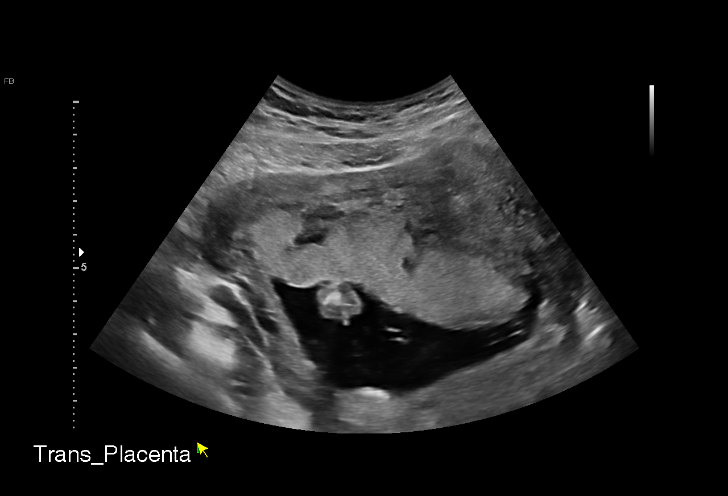
[im 38/112]
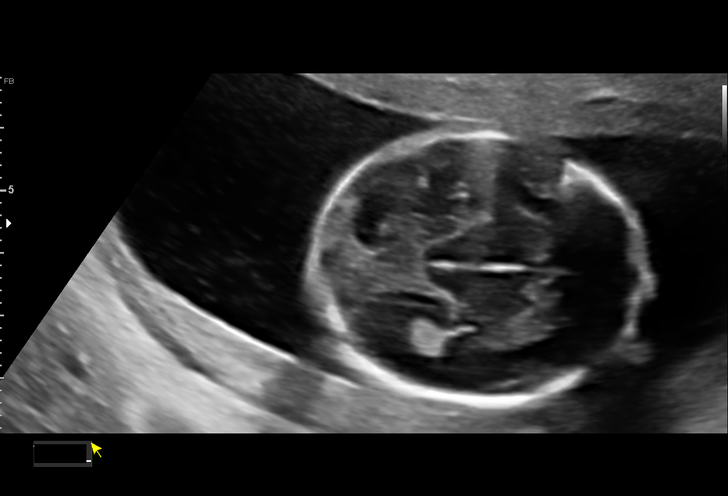
[im 46/112]
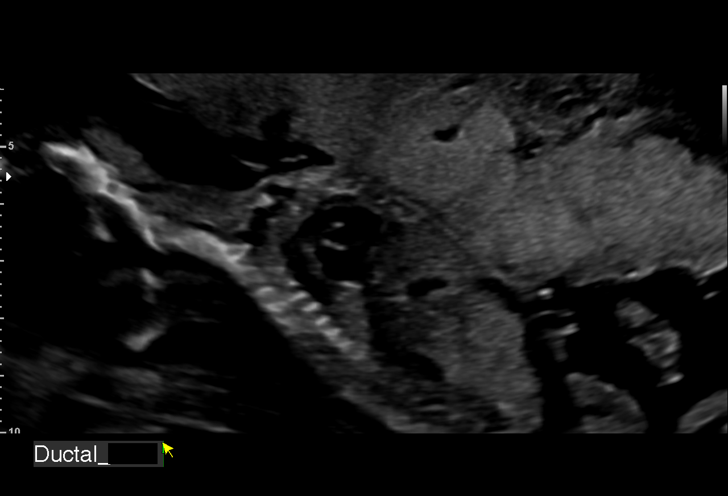
[im 58/112]
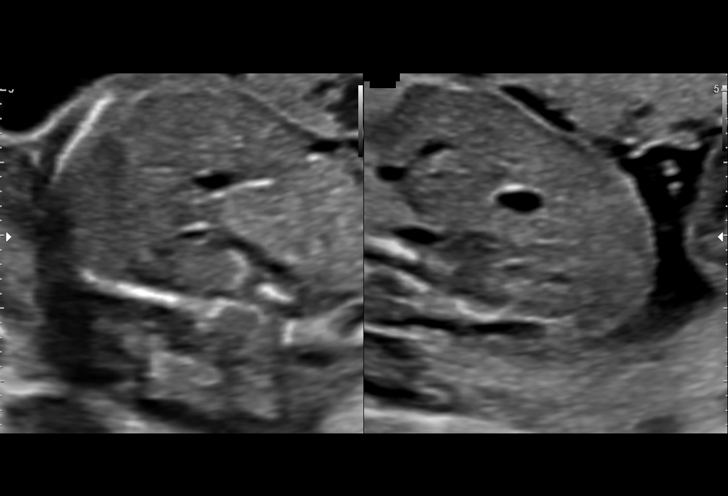
[im 66/112]
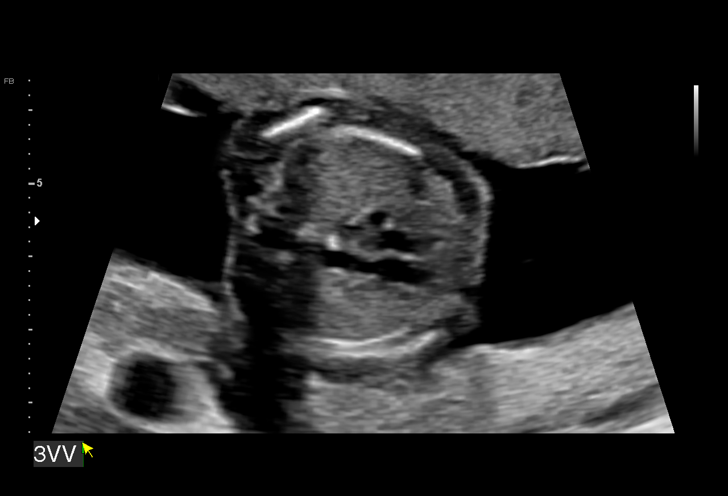
[im 75/112]
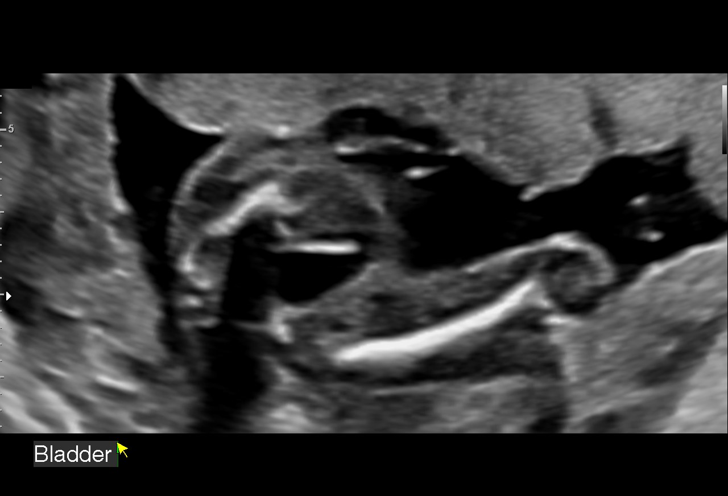
[im 83/112]
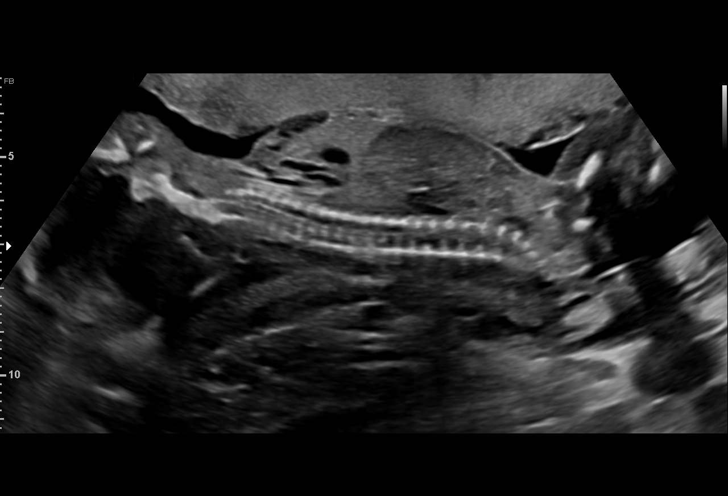
[im 91/112]
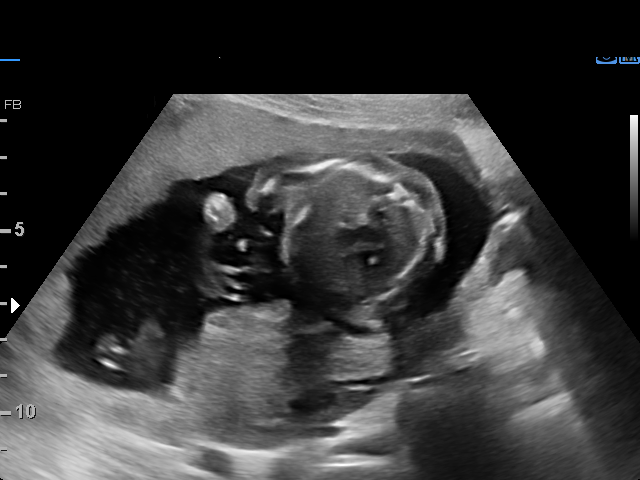
[im 99/112]
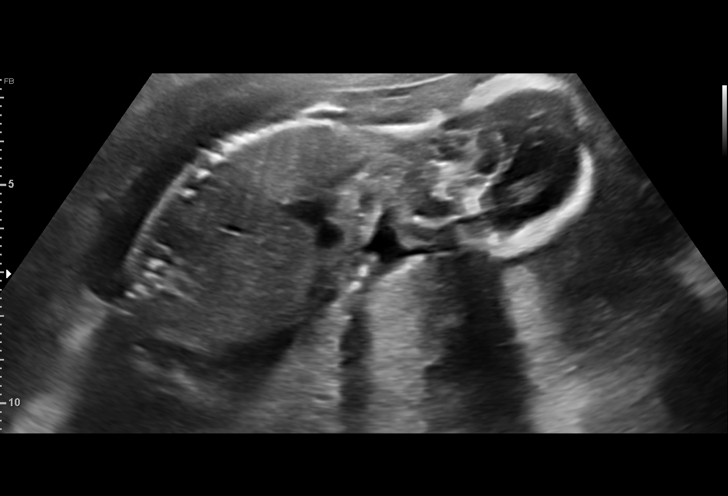
[im 107/112]
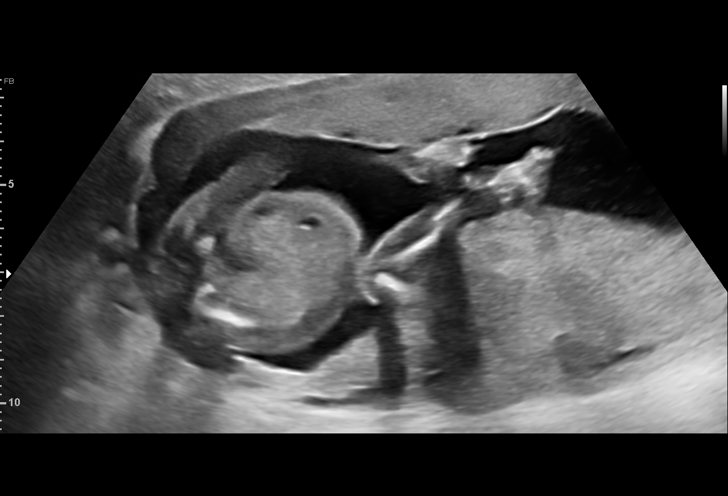

[13 of 28 positions shown; findings below may reference images not displayed]

Indications

 Encounter for antenatal screening for
 malformations
 Obesity complicating pregnancy, second
 trimester (BMI 31.32)
 19 weeks gestation of pregnancy
 LR NIPS/ Neg AFP
Fetal Evaluation

 Num Of Fetuses:         1
 Fetal Heart Rate(bpm):  144
 Cardiac Activity:       Observed
 Presentation:           Breech
 Placenta:               Anterior
 P. Cord Insertion:      Visualized, central

 Amniotic Fluid
 AFI FV:      Within normal limits

                             Largest Pocket(cm)

Biometry

 BPD:      42.7  mm     G. Age:  18w 6d         47  %    CI:        74.94   %    70 - 86
                                                         FL/HC:      17.8   %    16.1 -
 HC:      156.5  mm     G. Age:  18w 4d         22  %    HC/AC:      1.19        1.09 -
 AC:      131.8  mm     G. Age:  18w 5d         35  %    FL/BPD:     65.3   %
 FL:       27.9  mm     G. Age:  18w 4d         27  %    FL/AC:      21.2   %    20 - 24
 HUM:      27.9  mm     G. Age:  19w 0d         49  %
 CER:      18.1  mm     G. Age:  18w 0d        3.7  %
 NFT:       5.1  mm

 LV:        6.7  mm
 CM:        3.4  mm

 Est. FW:     249  gm      0 lb 9 oz     25  %
OB History

 Gravidity:    2         Term:   1
 Living:       1
Gestational Age

 LMP:           23w 3d        Date:  03/03/21                 EDD:   12/08/21
 U/S Today:     18w 5d                                        EDD:   01/10/22
 Best:          19w 0d     Det. By:  Early Ultrasound         EDD:   01/08/22
                                     (06/29/21)
Anatomy

 Cranium:               Appears normal         LVOT:                   Not well visualized
 Cavum:                 Appears normal         Aortic Arch:            Appears normal
 Ventricles:            Appears normal         Ductal Arch:            Appears normal
 Choroid Plexus:        Appears normal         Diaphragm:              Appears normal
 Cerebellum:            Appears normal         Stomach:                Appears normal, left
                                                                       sided
 Posterior Fossa:       Appears normal         Abdomen:                Appears normal
 Nuchal Fold:           Appears normal         Abdominal Wall:         Appears nml (cord
                                                                       insert, abd wall)
 Face:                  Appears normal         Cord Vessels:           Appears normal (3
                        (orbits and profile)                           vessel cord)
 Lips:                  Appears normal         Kidneys:                Appear normal
 Palate:                Appears normal         Bladder:                Appears normal
 Thoracic:              Appears normal         Spine:                  Appears normal
 Heart:                 Appears normal; EIF    Upper Extremities:      Appears normal
 RVOT:                  Not well visualized    Lower Extremities:      Appears normal

 Other:  Fetus appears to be female. Nasal bone visualized. 3VV and 3VTV
         visualized. Hands not well visualized.
Cervix Uterus Adnexa

 Cervix
 Length:           3.61  cm.
 Normal appearance by transabdominal scan.

 Uterus
 No abnormality visualized.

 Right Ovary
 See comments.

 Left Ovary
 See comments.
 Adnexa
 No abnormality visualized.

 Comment
 ? SMALL BILATERAL DERMOIDS
Impression

 Single intrauterine pregnancy here for a detailed anatomy
 elevated BMI
 Normal anatomy with measurements consistent with dates
 There is good fetal movement and amniotic fluid volume
 Suboptimal views of the fetal anatomy were obtained
 secondary to fetal position.

 ^1 cm dermoid cysts were seen bilaterally.
Recommendations

 Follow up growth in 4-6 weeks to complete the fetal anatomy.

## 2023-07-02 IMAGING — US US MFM OB FOLLOW-UP
1 series · 13 of 28 positions shown · non-contrast
Comparison: none

[Series 1: us mfm ob follow-up · 83 acquisitions, 13 frames shown]
[im 4/83]
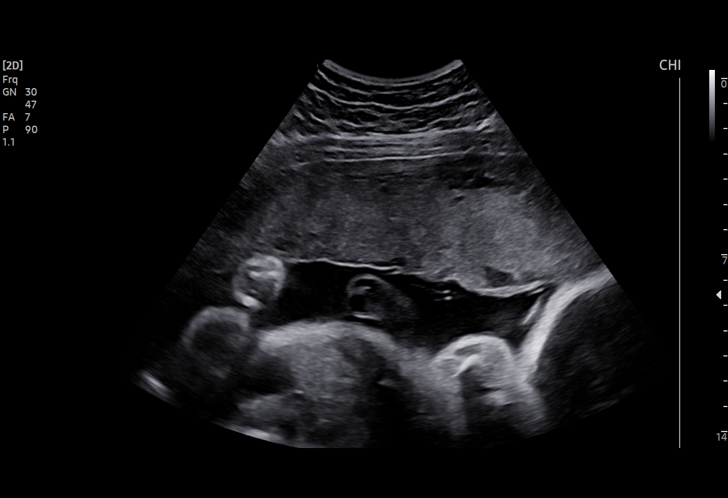
[im 10/83]
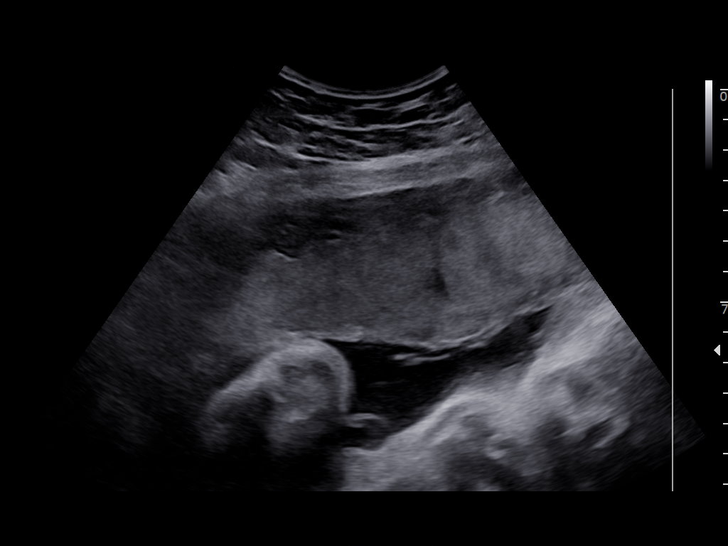
[im 16/83]
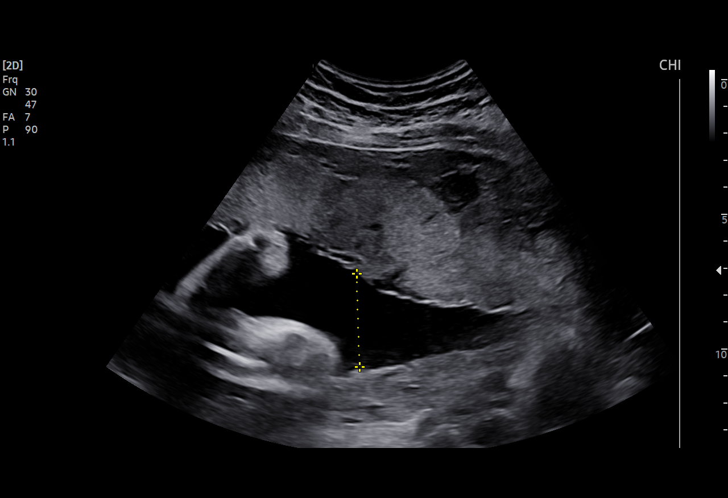
[im 22/83]
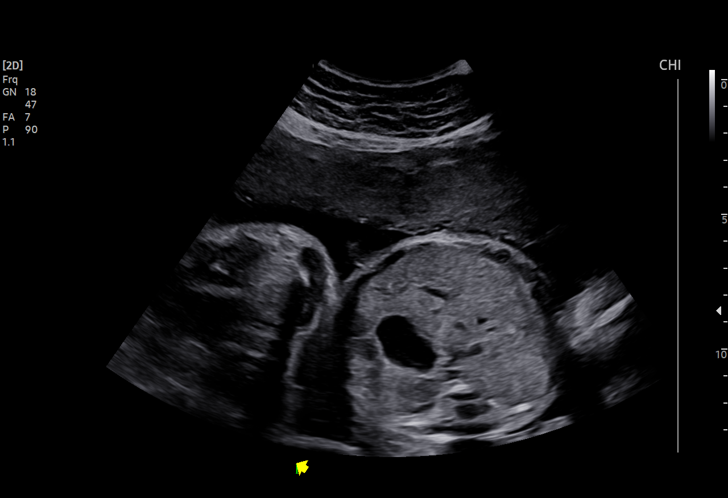
[im 28/83]
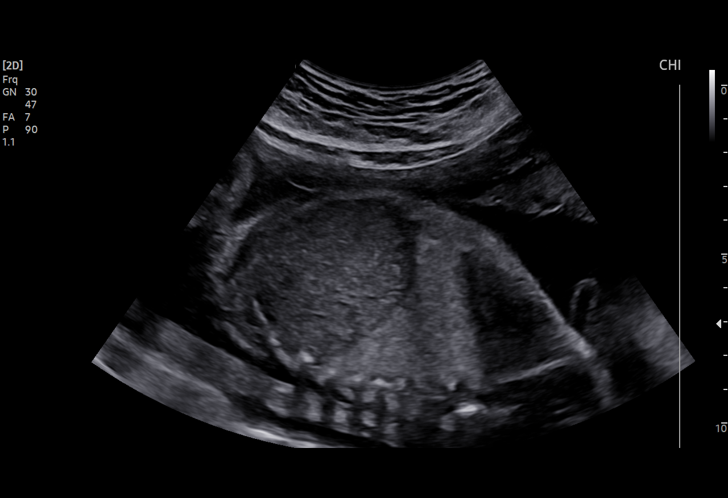
[im 34/83]
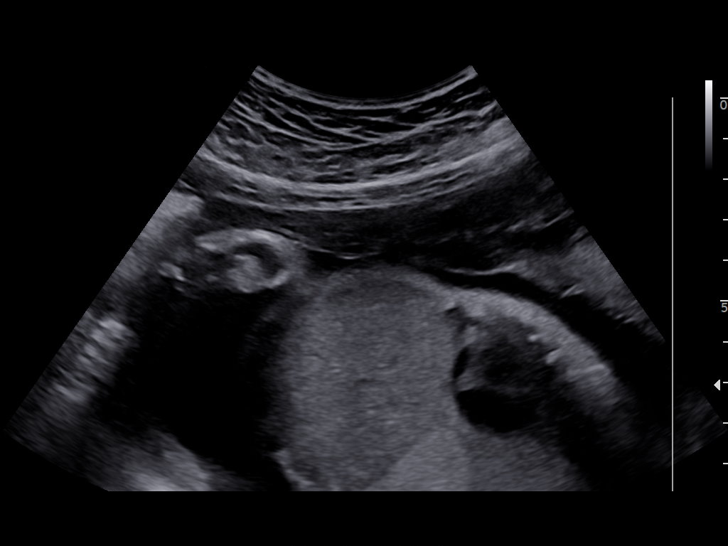
[im 43/83]
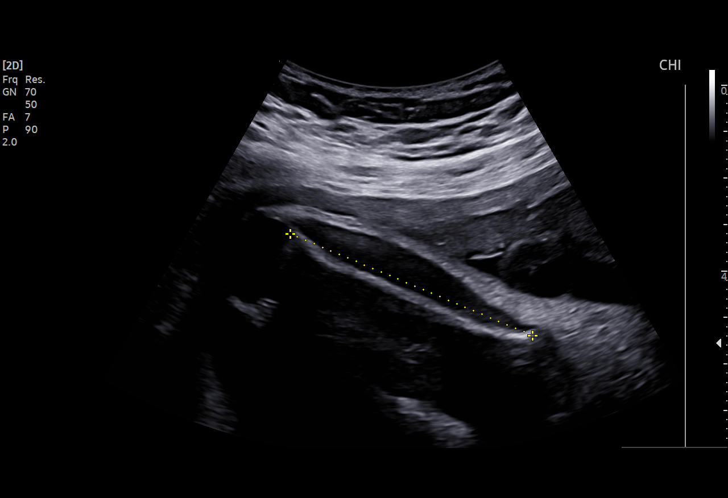
[im 49/83]
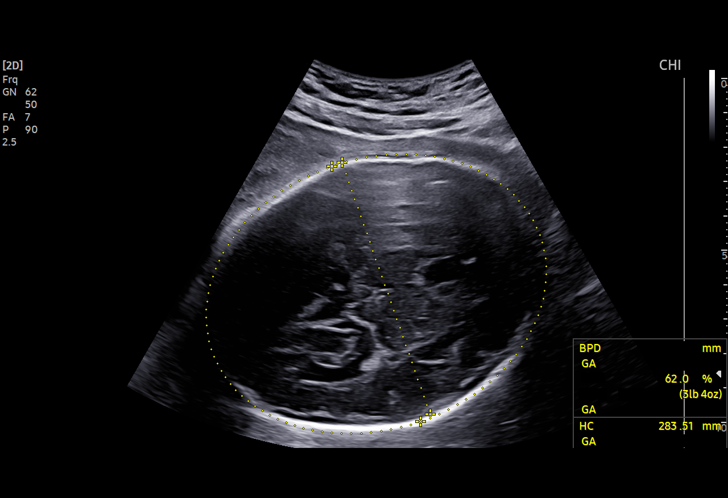
[im 55/83]
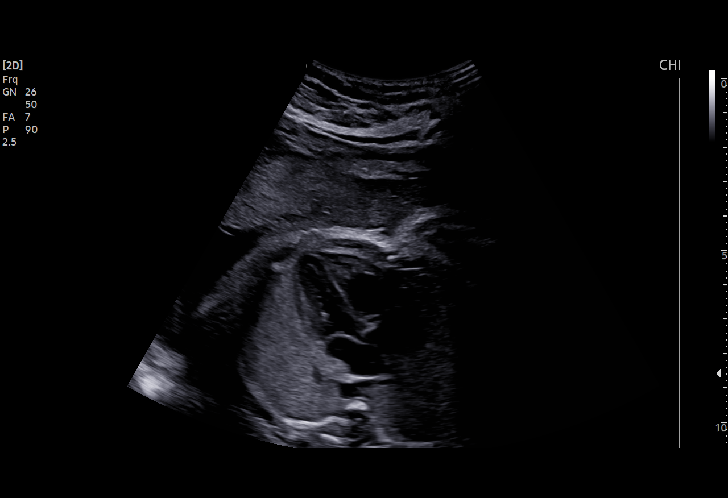
[im 61/83]
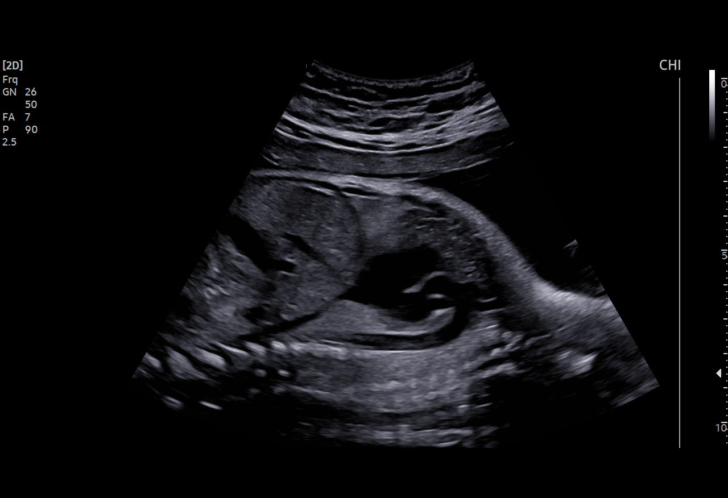
[im 67/83]
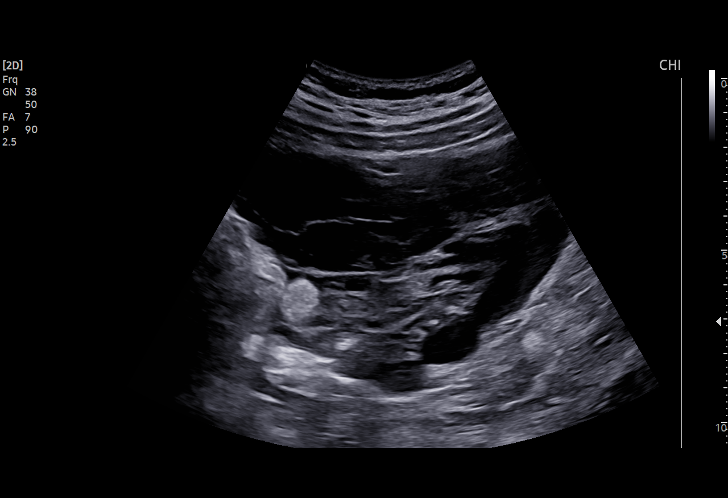
[im 73/83]
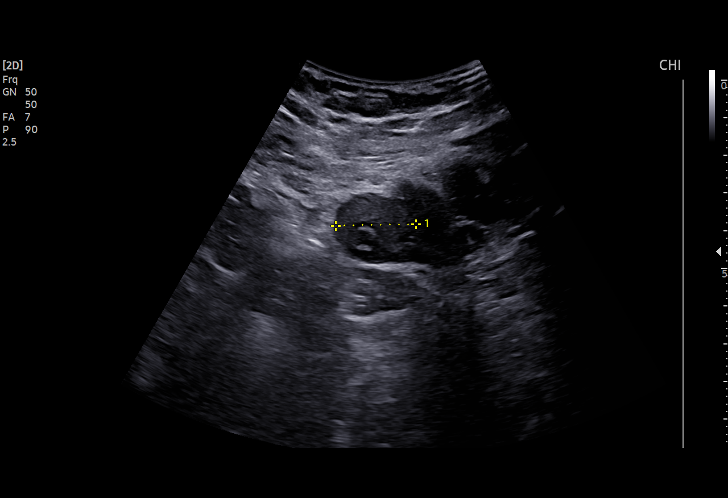
[im 79/83]
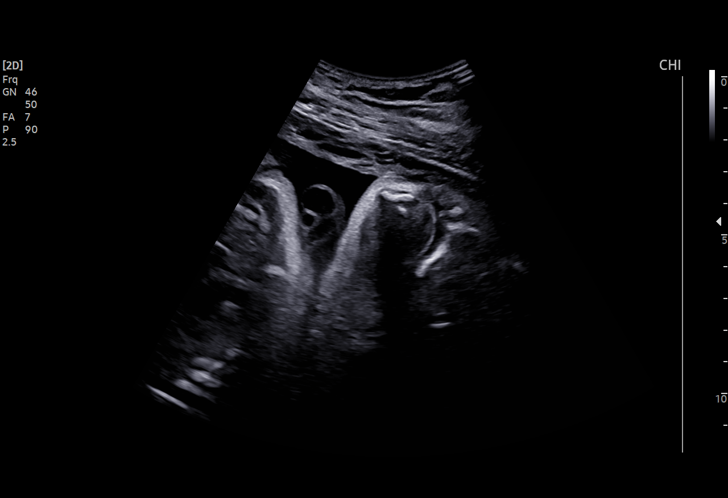

[13 of 28 positions shown; findings below may reference images not displayed]

Indications

 Echogenic intracardiac focus of the heart
 (EIF)
 Ovarian dermoid cyst complicating              O34.80,
 pregnancy, antepartum
 Obesity complicating pregnancy, second
 trimester (BMI 31.32)
 LR NIPS/ Neg AFP
 30 weeks gestation of pregnancy
Fetal Evaluation

 Num Of Fetuses:         1
 Fetal Heart Rate(bpm):  152
 Cardiac Activity:       Observed
 Presentation:           Cephalic
 Placenta:               Anterior
 P. Cord Insertion:      Previously Visualized

 Amniotic Fluid
 AFI FV:      Within normal limits

 AFI Sum(cm)     %Tile       Largest Pocket(cm)
 11.95           29

 RUQ(cm)       RLQ(cm)       LUQ(cm)        LLQ(cm)

Biometry

 BPD:      76.9  mm     G. Age:  30w 6d         56  %    CI:        74.08   %    70 - 86
                                                         FL/HC:      19.7   %    19.2 -
 HC:    283.71   mm     G. Age:  31w 1d         38  %    HC/AC:      1.11        0.99 -
 AC:    255.65   mm     G. Age:  29w 5d         29  %    FL/BPD:     72.8   %    71 - 87
 FL:      55.99  mm     G. Age:  29w 3d         16  %    FL/AC:      21.9   %    20 - 24
 LV:        5.3  mm

 Est. FW:    4676  gm      3 lb 4 oz     24  %
OB History

 Gravidity:    2         Term:   1
 Living:       1
Gestational Age

 LMP:           34w 5d        Date:  03/03/21                  EDD:   12/08/21
 U/S Today:     30w 2d                                        EDD:   01/08/22
 Best:          30w 2d     Det. By:  Early Ultrasound         EDD:   01/08/22
                                     (06/29/21)
Anatomy

 Cranium:               Appears normal         LVOT:                   Appears normal
 Cavum:                 Appears normal         Aortic Arch:            Appears normal
 Ventricles:            Appears normal         Ductal Arch:            Appears normal
 Choroid Plexus:        Previously seen        Diaphragm:              Appears normal
 Cerebellum:            Previously seen        Stomach:                Appears normal, left
                                                                       sided
 Posterior Fossa:       Previously seen        Abdomen:                Previously seen
 Nuchal Fold:           Not applicable (>20    Abdominal Wall:         Previously seen
                        wks GA)
 Face:                  Orbits and profile     Cord Vessels:           Appears normal (3
                        previously seen                                vessel cord)
 Lips:                  Appears normal         Kidneys:                Appear normal
 Palate:                Previously seen        Bladder:                Appears normal
 Thoracic:              Appears normal         Spine:                  Previously seen
 Heart:                 Appears normal         Upper Extremities:      Previously seen
 RVOT:                  Appears normal         Lower Extremities:      Previously seen

 Other:  Open hands prev visualized. Previously fetus appears to be female.
         Nasal bone, IVC/SVC, 3VV, 3VTV previously visualized.
Cervix Uterus Adnexa

 Cervix
 Not visualized (advanced GA >50wks)

 Uterus
 No abnormality visualized.

 Right Ovary
 Cystic teratoma measures 1.2 cm sag X 1.36 cm trsv X 3.6 cm AP
 Left Ovary
 Cystic teratoma measures 1.2 cm sag X 1.47 cm trsv X 1.48 cm AP
Impression

 Patient return for fetal growth assessment.

 Amniotic fluid is normal and good fetal activity seen.  Fetal
 growth is appropriate for gestational age.  Bilateral dermoid
 cysts are seen.  Patient does not have symptoms pertaining
 to the dermoid.

 I encouraged the patient to screen for gestational diabetes.
Recommendations

 Follow-up scans as clinically indicated.
                [employer], [place]

## 2023-09-25 ENCOUNTER — Ambulatory Visit: Admitting: Obstetrics and Gynecology

## 2023-10-07 ENCOUNTER — Ambulatory Visit: Admitting: Obstetrics and Gynecology

## 2024-05-25 ENCOUNTER — Emergency Department: Admit: 2024-05-25 | Discharge: 2024-05-25 | Payer: MEDICAID

## 2024-05-25 ENCOUNTER — Inpatient Hospital Stay: Admit: 2024-05-25 | Discharge: 2024-05-25

## 2024-05-25 ENCOUNTER — Inpatient Hospital Stay: Admit: 2024-05-25 | Discharge: 2024-05-25 | Payer: MEDICAID

## 2024-05-25 DIAGNOSIS — N898 Other specified noninflammatory disorders of vagina: Principal | ICD-10-CM

## 2024-05-25 DIAGNOSIS — Z202 Contact with and (suspected) exposure to infections with a predominantly sexual mode of transmission: Secondary | ICD-10-CM

## 2024-05-25 DIAGNOSIS — M899 Disorder of bone, unspecified: Secondary | ICD-10-CM

## 2024-05-25 DIAGNOSIS — Z9109 Other allergy status, other than to drugs and biological substances: Secondary | ICD-10-CM

## 2024-05-25 DIAGNOSIS — 493 Asthma: Principal | ICD-9-CM

## 2024-05-25 DIAGNOSIS — N809 Endometriosis, unspecified: Secondary | ICD-10-CM

## 2024-05-25 DIAGNOSIS — L659 Nonscarring hair loss, unspecified: Secondary | ICD-10-CM

## 2024-05-25 DIAGNOSIS — D649 Anemia, unspecified: Secondary | ICD-10-CM

## 2024-05-25 DIAGNOSIS — F909 Attention-deficit hyperactivity disorder, unspecified type: Secondary | ICD-10-CM

## 2024-05-25 DIAGNOSIS — A64 Unspecified sexually transmitted disease: Secondary | ICD-10-CM

## 2024-05-25 DIAGNOSIS — G4700 Insomnia, unspecified: Secondary | ICD-10-CM

## 2024-05-25 DIAGNOSIS — J45909 Unspecified asthma, uncomplicated: Secondary | ICD-10-CM

## 2024-05-25 DIAGNOSIS — F329 Major depressive disorder, single episode, unspecified: Secondary | ICD-10-CM

## 2024-05-25 DIAGNOSIS — M24541 Contracture, right hand: Principal | ICD-10-CM

## 2024-05-25 DIAGNOSIS — M79646 Pain in unspecified finger(s): Secondary | ICD-10-CM

## 2024-05-25 DIAGNOSIS — M419 Scoliosis, unspecified: Secondary | ICD-10-CM

## 2024-05-25 DIAGNOSIS — F319 Bipolar disorder, unspecified: Secondary | ICD-10-CM

## 2024-05-25 DIAGNOSIS — F4310 Post-traumatic stress disorder, unspecified: Secondary | ICD-10-CM

## 2024-05-25 DIAGNOSIS — F32A Depression: Secondary | ICD-10-CM

## 2024-05-25 MED ORDER — AZITHROMYCIN 250 MG PO TABS
1000 mg | Freq: Once | ORAL | Status: CP
Start: 2024-05-25 — End: ?

## 2024-05-25 MED ORDER — AZITHROMYCIN 500 MG PO TABS
500 mg | Freq: Every day | ORAL | 0 refills | 5.00000 days | Status: CP
Start: 2024-05-25 — End: ?

## 2024-05-25 MED ORDER — METRONIDAZOLE 500 MG PO TABS
2000 mg | Freq: Once | ORAL | Status: CP
Start: 2024-05-25 — End: ?

## 2024-05-25 MED ORDER — CEFTRIAXONE SODIUM 500 MG IJ SOLR
500 mg | Freq: Once | INTRAMUSCULAR | Status: CP
Start: 2024-05-25 — End: ?

## 2024-05-25 MED ORDER — ONDANSETRON 4 MG PO TBDP
4 mg | Freq: Once | ORAL | Status: CP
Start: 2024-05-25 — End: ?
